# Patient Record
Sex: Female | Born: 1955 | Race: White | Hispanic: No | Marital: Single | Smoking: Current every day smoker
Health system: Southern US, Community
[De-identification: ages and names within clinical notes are randomized; demographics above are authoritative.]

## PROBLEM LIST (undated history)

## (undated) DIAGNOSIS — J449 Chronic obstructive pulmonary disease, unspecified: Secondary | ICD-10-CM

## (undated) DIAGNOSIS — F419 Anxiety disorder, unspecified: Secondary | ICD-10-CM

## (undated) DIAGNOSIS — J45909 Unspecified asthma, uncomplicated: Secondary | ICD-10-CM

## (undated) DIAGNOSIS — M549 Dorsalgia, unspecified: Secondary | ICD-10-CM

## (undated) DIAGNOSIS — G8929 Other chronic pain: Secondary | ICD-10-CM

## (undated) HISTORY — PX: HERNIA REPAIR: SHX51

## (undated) HISTORY — PX: BLADDER REPAIR: SHX76

## (undated) HISTORY — PX: ABDOMINAL HYSTERECTOMY: SHX81

---

## 2001-06-21 ENCOUNTER — Inpatient Hospital Stay (HOSPITAL_COMMUNITY): Admission: RE | Admit: 2001-06-21 | Discharge: 2001-06-23 | Payer: Self-pay | Admitting: *Deleted

## 2001-08-07 ENCOUNTER — Encounter: Payer: Self-pay | Admitting: General Surgery

## 2001-08-09 ENCOUNTER — Ambulatory Visit (HOSPITAL_COMMUNITY): Admission: RE | Admit: 2001-08-09 | Discharge: 2001-08-10 | Payer: Self-pay | Admitting: General Surgery

## 2001-11-19 ENCOUNTER — Other Ambulatory Visit: Admission: RE | Admit: 2001-11-19 | Discharge: 2001-11-19 | Payer: Self-pay | Admitting: Internal Medicine

## 2001-11-27 ENCOUNTER — Other Ambulatory Visit: Admission: RE | Admit: 2001-11-27 | Discharge: 2001-11-27 | Payer: Self-pay | Admitting: Obstetrics and Gynecology

## 2001-12-12 ENCOUNTER — Encounter: Payer: Self-pay | Admitting: Emergency Medicine

## 2001-12-12 ENCOUNTER — Emergency Department (HOSPITAL_COMMUNITY): Admission: EM | Admit: 2001-12-12 | Discharge: 2001-12-12 | Payer: Self-pay | Admitting: Emergency Medicine

## 2002-01-22 ENCOUNTER — Encounter: Payer: Self-pay | Admitting: Obstetrics and Gynecology

## 2002-01-29 ENCOUNTER — Encounter (INDEPENDENT_AMBULATORY_CARE_PROVIDER_SITE_OTHER): Payer: Self-pay | Admitting: Specialist

## 2002-01-29 ENCOUNTER — Inpatient Hospital Stay (HOSPITAL_COMMUNITY): Admission: RE | Admit: 2002-01-29 | Discharge: 2002-01-31 | Payer: Self-pay | Admitting: Obstetrics and Gynecology

## 2002-03-03 ENCOUNTER — Emergency Department (HOSPITAL_COMMUNITY): Admission: EM | Admit: 2002-03-03 | Discharge: 2002-03-04 | Payer: Self-pay

## 2002-04-01 ENCOUNTER — Emergency Department (HOSPITAL_COMMUNITY): Admission: EM | Admit: 2002-04-01 | Discharge: 2002-04-02 | Payer: Self-pay | Admitting: Emergency Medicine

## 2002-04-02 ENCOUNTER — Encounter: Payer: Self-pay | Admitting: Emergency Medicine

## 2002-04-16 ENCOUNTER — Emergency Department (HOSPITAL_COMMUNITY): Admission: EM | Admit: 2002-04-16 | Discharge: 2002-04-16 | Payer: Self-pay | Admitting: *Deleted

## 2002-04-28 ENCOUNTER — Emergency Department (HOSPITAL_COMMUNITY): Admission: EM | Admit: 2002-04-28 | Discharge: 2002-04-28 | Payer: Self-pay | Admitting: Emergency Medicine

## 2002-04-28 ENCOUNTER — Encounter: Payer: Self-pay | Admitting: Emergency Medicine

## 2002-06-14 ENCOUNTER — Emergency Department (HOSPITAL_COMMUNITY): Admission: EM | Admit: 2002-06-14 | Discharge: 2002-06-14 | Payer: Self-pay | Admitting: Emergency Medicine

## 2002-06-14 ENCOUNTER — Encounter: Payer: Self-pay | Admitting: Emergency Medicine

## 2002-07-02 ENCOUNTER — Emergency Department (HOSPITAL_COMMUNITY): Admission: EM | Admit: 2002-07-02 | Discharge: 2002-07-03 | Payer: Self-pay | Admitting: Emergency Medicine

## 2002-07-03 ENCOUNTER — Encounter: Payer: Self-pay | Admitting: Emergency Medicine

## 2002-07-28 ENCOUNTER — Emergency Department (HOSPITAL_COMMUNITY): Admission: EM | Admit: 2002-07-28 | Discharge: 2002-07-28 | Payer: Self-pay | Admitting: Emergency Medicine

## 2002-07-28 ENCOUNTER — Encounter: Payer: Self-pay | Admitting: Emergency Medicine

## 2002-08-05 ENCOUNTER — Other Ambulatory Visit: Admission: RE | Admit: 2002-08-05 | Discharge: 2002-08-05 | Payer: Self-pay | Admitting: *Deleted

## 2002-09-19 ENCOUNTER — Observation Stay (HOSPITAL_COMMUNITY): Admission: RE | Admit: 2002-09-19 | Discharge: 2002-09-20 | Payer: Self-pay | Admitting: *Deleted

## 2002-09-19 ENCOUNTER — Encounter (INDEPENDENT_AMBULATORY_CARE_PROVIDER_SITE_OTHER): Payer: Self-pay

## 2002-11-25 ENCOUNTER — Ambulatory Visit (HOSPITAL_BASED_OUTPATIENT_CLINIC_OR_DEPARTMENT_OTHER): Admission: RE | Admit: 2002-11-25 | Discharge: 2002-11-25 | Payer: Self-pay | Admitting: Otolaryngology

## 2002-11-25 ENCOUNTER — Encounter (INDEPENDENT_AMBULATORY_CARE_PROVIDER_SITE_OTHER): Payer: Self-pay | Admitting: *Deleted

## 2003-03-18 IMAGING — CT CT ABDOMEN W/ CM
1 of 4 series · 14 of 32 positions shown, 19 images · IV contrast (omnipaque)
Comparison: none

FINDINGS
CLINICAL DATA: RIGHT LOWER QUADRANT PAIN.
CT OF THE ABDOMEN AND PELVIS, 07/03/02
ROUTINE MULTIDETECTOR HELICAL CT IMAGING WAS PERFORMED THROUGH THE ABDOMEN AND PELVIS FOLLOWING
ADMINISTRATION OF DILUTE ORAL CONTRAST AND 100 CC OF OMNIPAQUE 300 IV.
COMPARISON 06/14/02.
CT OF THE ABDOMEN WITH CONTRAST
FOCAL FAT IS AGAIN NOTED WITHIN THE LIVER ALONG THE FALCIFORM LIGAMENT.  OTHERWISE, NO FOCAL
LESIONS IN THE LIVER, SPLEEN, PANCREAS, ADRENALS, OR KIDNEYS.  NO EVIDENCE OF HYDRONEPHROSIS OR
STONE.  NO FREE FLUID, FREE AIR, OR ADENOPATHY.  SCATTERED DIVERTICULA ARE NOTED THROUGHOUT THE
COLON.  NO EVIDENCE OF DIVERTICULITIS.
IMPRESSION
NO ACUTE ABNORMALITY IN THE ABDOMEN.
CT OF THE PELVIS WITH CONTRAST
AGAIN, NUMEROUS DIVERTICULA ARE NOTED THROUGHOUT THE COLON.  NO EVIDENCE OF ACTIVE DIVERTICULITIS.
NO FREE FLUID, FREE AIR, OR ADENOPATHY.  IT APPEARS THAT THE PATIENT IS STATUS POST HYSTERECTOMY.
DIVERTICULOSIS.  NO ACUTE DISEASE.

[Series 3: abd/pelvis 5.0 b30f · axial · 0.65mm/px · z∈[-686,-310]mm · 14 of 87 slices shown, 19 images]
[im 6/87  soft-tissue]
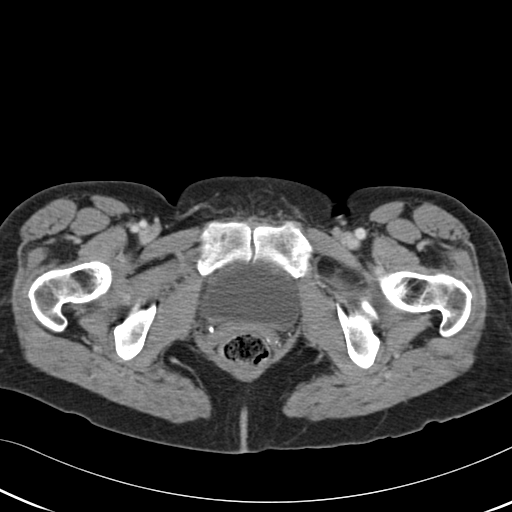
[im 6/87  bone]
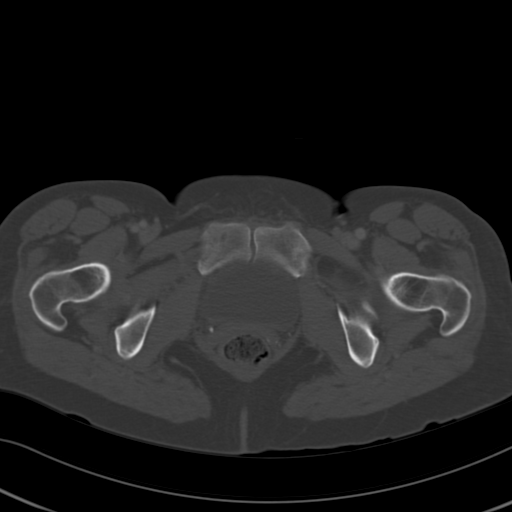
[im 11/87  soft-tissue]
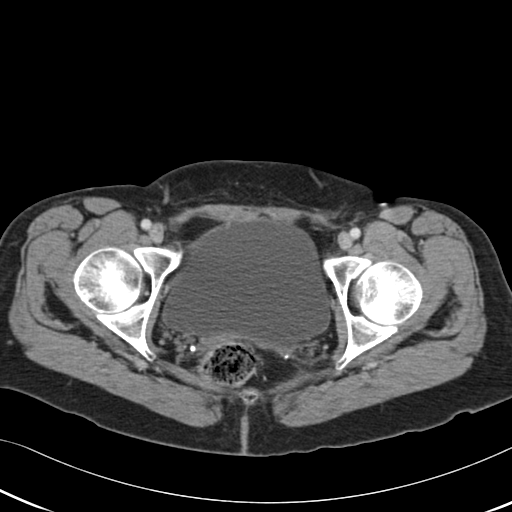
[im 21/87  soft-tissue]
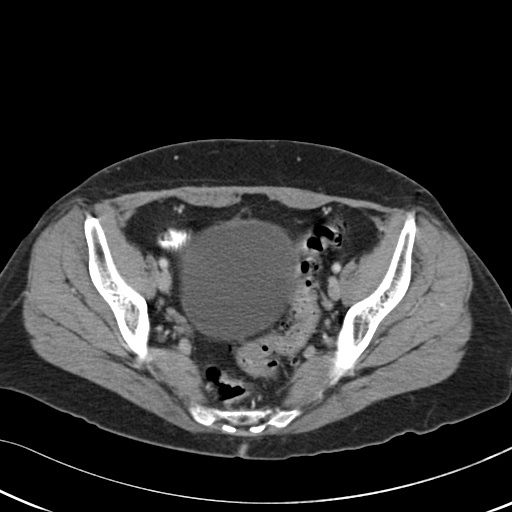
[im 26/87  soft-tissue]
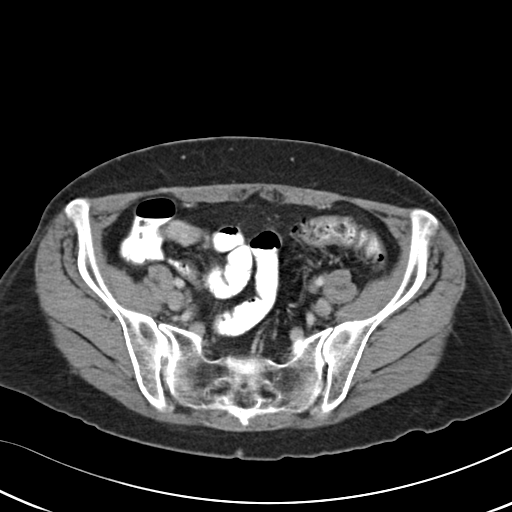
[im 31/87  soft-tissue]
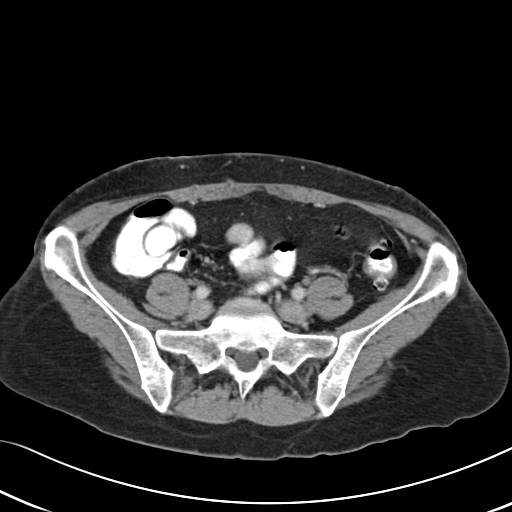
[im 36/87  soft-tissue]
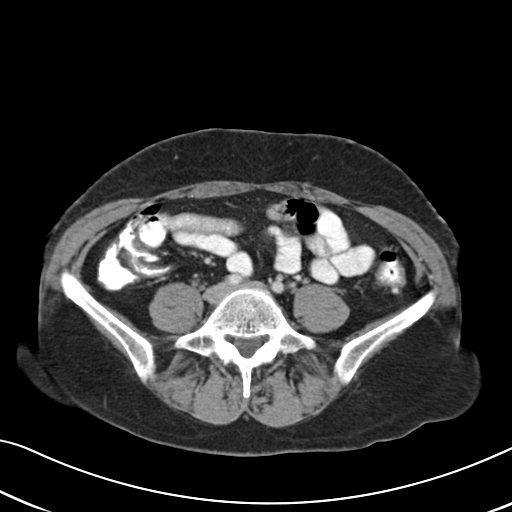
[im 46/87  soft-tissue]
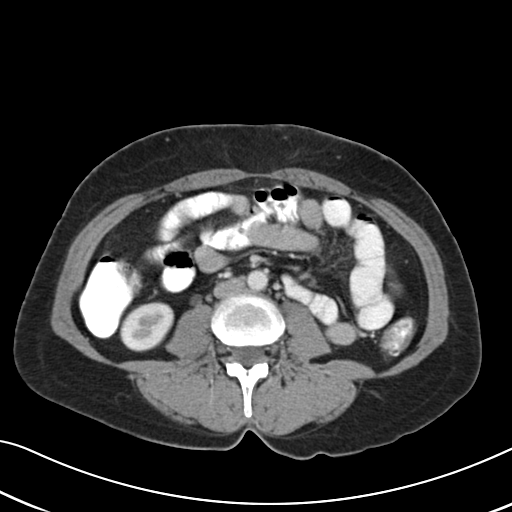
[im 51/87  soft-tissue]
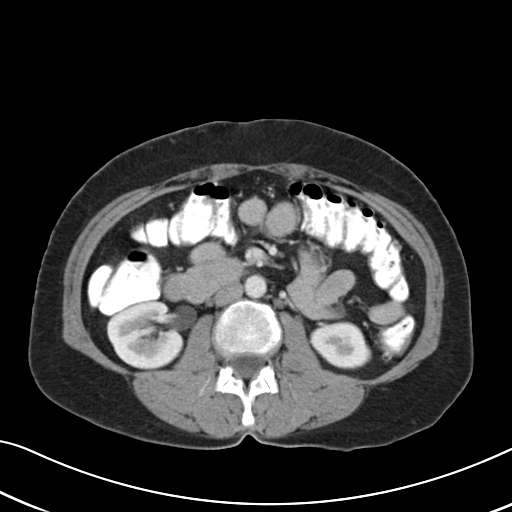
[im 56/87  soft-tissue]
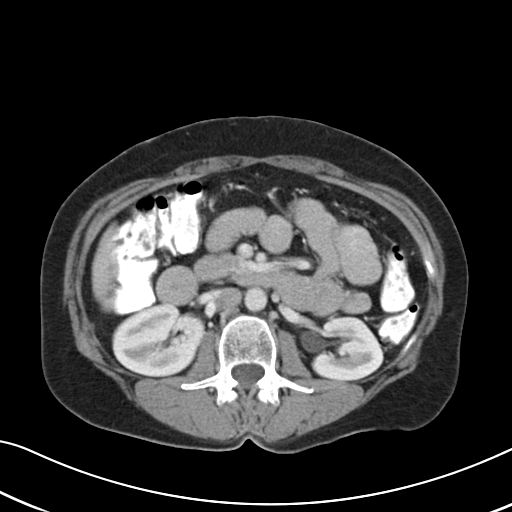
[im 56/87  bone]
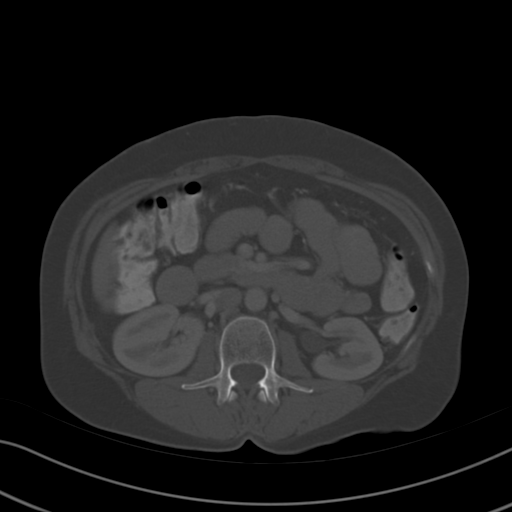
[im 61/87  soft-tissue]
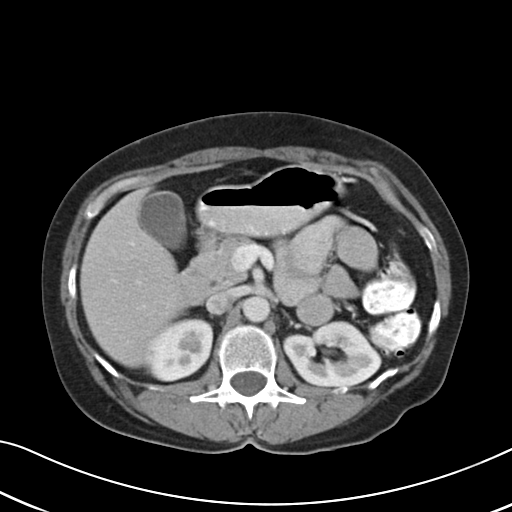
[im 66/87  soft-tissue]
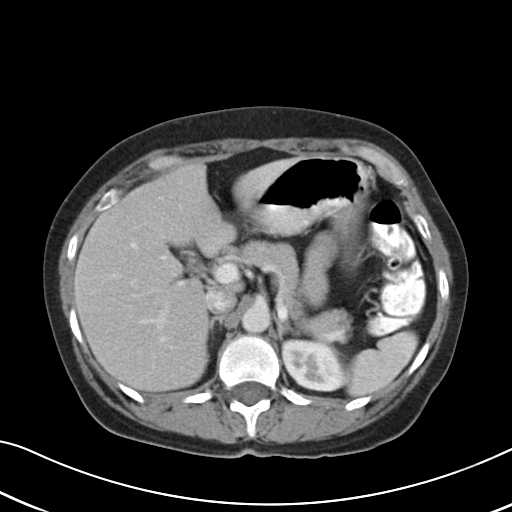
[im 66/87  lung]
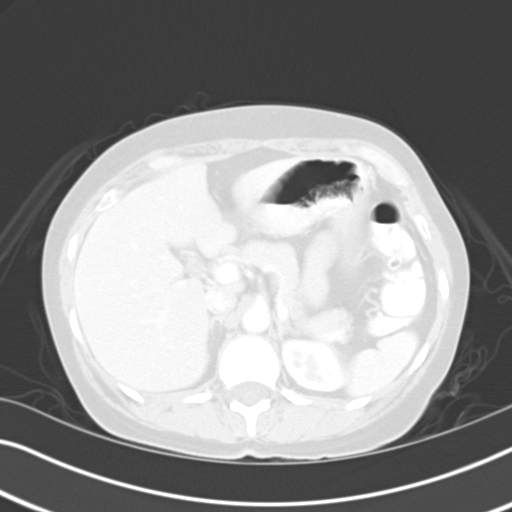
[im 71/87  lung]
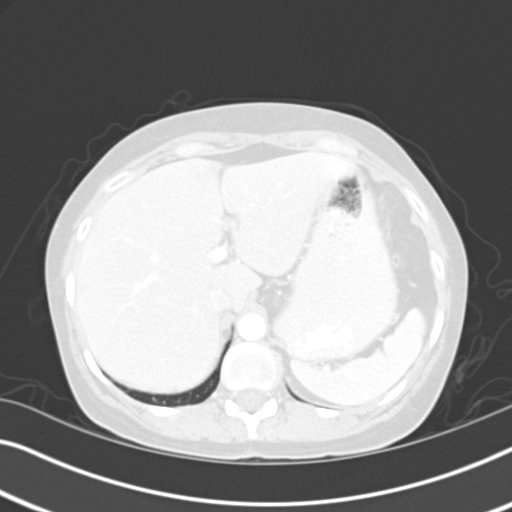
[im 76/87  soft-tissue]
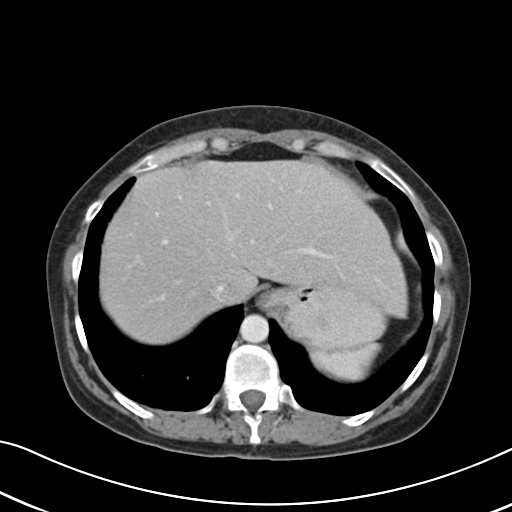
[im 76/87  lung]
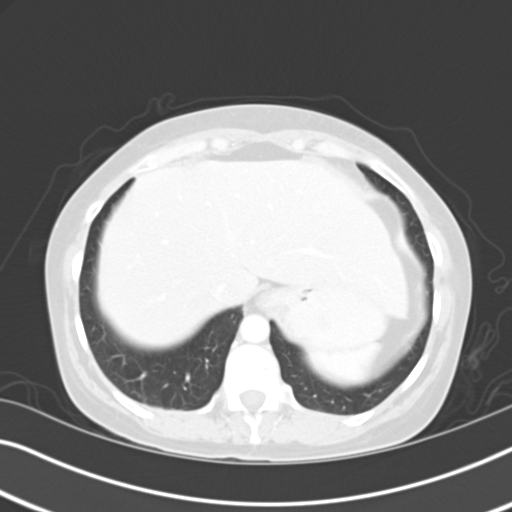
[im 81/87  soft-tissue]
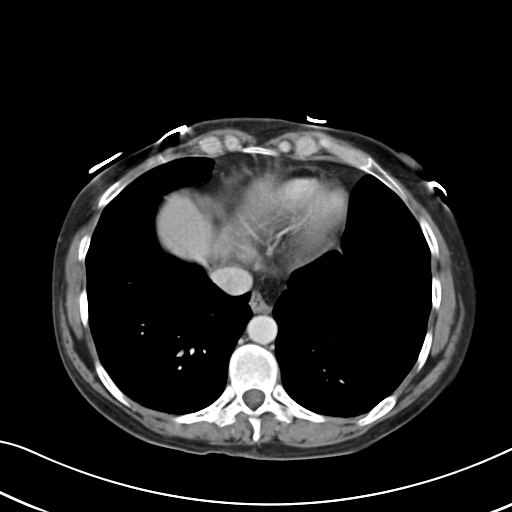
[im 81/87  lung]
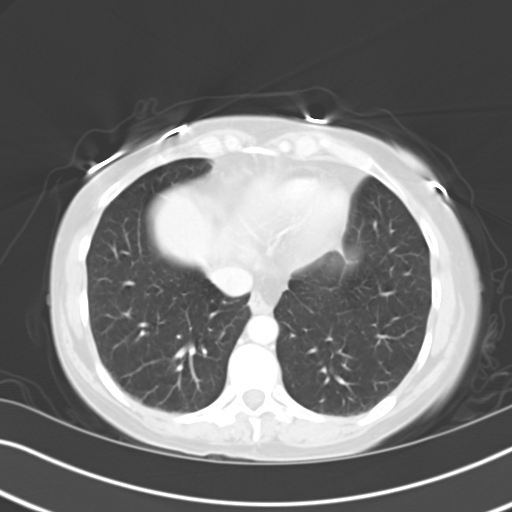

[14 of 32 positions shown; findings below may reference images not displayed]

## 2004-02-02 ENCOUNTER — Other Ambulatory Visit: Admission: RE | Admit: 2004-02-02 | Discharge: 2004-02-02 | Payer: Self-pay | Admitting: *Deleted

## 2004-05-30 ENCOUNTER — Emergency Department (HOSPITAL_COMMUNITY): Admission: EM | Admit: 2004-05-30 | Discharge: 2004-05-31 | Payer: Self-pay | Admitting: Emergency Medicine

## 2004-06-30 ENCOUNTER — Ambulatory Visit (HOSPITAL_COMMUNITY): Admission: RE | Admit: 2004-06-30 | Discharge: 2004-06-30 | Payer: Self-pay | Admitting: General Surgery

## 2004-07-26 ENCOUNTER — Ambulatory Visit: Payer: Self-pay | Admitting: Obstetrics and Gynecology

## 2004-09-15 ENCOUNTER — Inpatient Hospital Stay (HOSPITAL_COMMUNITY): Admission: RE | Admit: 2004-09-15 | Discharge: 2004-09-16 | Payer: Self-pay | Admitting: Obstetrics and Gynecology

## 2004-10-21 ENCOUNTER — Emergency Department (HOSPITAL_COMMUNITY): Admission: EM | Admit: 2004-10-21 | Discharge: 2004-10-21 | Payer: Self-pay | Admitting: Emergency Medicine

## 2004-11-02 ENCOUNTER — Ambulatory Visit: Payer: Self-pay | Admitting: Internal Medicine

## 2004-11-11 ENCOUNTER — Ambulatory Visit: Payer: Self-pay | Admitting: Internal Medicine

## 2004-11-11 ENCOUNTER — Ambulatory Visit (HOSPITAL_COMMUNITY): Admission: RE | Admit: 2004-11-11 | Discharge: 2004-11-11 | Payer: Self-pay | Admitting: Internal Medicine

## 2004-12-02 ENCOUNTER — Emergency Department (HOSPITAL_COMMUNITY): Admission: EM | Admit: 2004-12-02 | Discharge: 2004-12-02 | Payer: Self-pay | Admitting: Emergency Medicine

## 2004-12-02 ENCOUNTER — Ambulatory Visit (HOSPITAL_COMMUNITY): Admission: RE | Admit: 2004-12-02 | Discharge: 2004-12-02 | Payer: Self-pay | Admitting: Internal Medicine

## 2004-12-23 ENCOUNTER — Emergency Department (HOSPITAL_COMMUNITY): Admission: EM | Admit: 2004-12-23 | Discharge: 2004-12-23 | Payer: Self-pay | Admitting: Emergency Medicine

## 2005-01-16 ENCOUNTER — Ambulatory Visit (HOSPITAL_COMMUNITY): Admission: RE | Admit: 2005-01-16 | Discharge: 2005-01-16 | Payer: Self-pay | Admitting: Internal Medicine

## 2005-04-19 ENCOUNTER — Inpatient Hospital Stay (HOSPITAL_COMMUNITY): Admission: RE | Admit: 2005-04-19 | Discharge: 2005-04-21 | Payer: Self-pay | Admitting: Obstetrics & Gynecology

## 2005-04-22 ENCOUNTER — Emergency Department (HOSPITAL_COMMUNITY): Admission: EM | Admit: 2005-04-22 | Discharge: 2005-04-22 | Payer: Self-pay | Admitting: Emergency Medicine

## 2005-07-17 ENCOUNTER — Inpatient Hospital Stay (HOSPITAL_COMMUNITY): Admission: EM | Admit: 2005-07-17 | Discharge: 2005-07-20 | Payer: Self-pay | Admitting: Emergency Medicine

## 2005-07-18 ENCOUNTER — Ambulatory Visit: Payer: Self-pay | Admitting: *Deleted

## 2005-10-05 ENCOUNTER — Encounter (INDEPENDENT_AMBULATORY_CARE_PROVIDER_SITE_OTHER): Payer: Self-pay | Admitting: *Deleted

## 2005-10-05 ENCOUNTER — Inpatient Hospital Stay (HOSPITAL_COMMUNITY): Admission: RE | Admit: 2005-10-05 | Discharge: 2005-10-07 | Payer: Self-pay | Admitting: *Deleted

## 2005-10-14 ENCOUNTER — Emergency Department (HOSPITAL_COMMUNITY): Admission: EM | Admit: 2005-10-14 | Discharge: 2005-10-14 | Payer: Self-pay | Admitting: Emergency Medicine

## 2006-01-19 ENCOUNTER — Emergency Department (HOSPITAL_COMMUNITY): Admission: EM | Admit: 2006-01-19 | Discharge: 2006-01-19 | Payer: Self-pay | Admitting: Emergency Medicine

## 2006-03-05 ENCOUNTER — Emergency Department (HOSPITAL_COMMUNITY): Admission: EM | Admit: 2006-03-05 | Discharge: 2006-03-05 | Payer: Self-pay | Admitting: Emergency Medicine

## 2006-04-13 ENCOUNTER — Emergency Department (HOSPITAL_COMMUNITY): Admission: EM | Admit: 2006-04-13 | Discharge: 2006-04-13 | Payer: Self-pay | Admitting: Emergency Medicine

## 2006-05-12 ENCOUNTER — Inpatient Hospital Stay (HOSPITAL_COMMUNITY): Admission: EM | Admit: 2006-05-12 | Discharge: 2006-05-18 | Payer: Self-pay | Admitting: Psychiatry

## 2006-05-13 ENCOUNTER — Ambulatory Visit: Payer: Self-pay | Admitting: Psychiatry

## 2006-05-14 ENCOUNTER — Encounter (HOSPITAL_COMMUNITY): Payer: Self-pay | Admitting: Psychiatry

## 2006-05-22 ENCOUNTER — Inpatient Hospital Stay (HOSPITAL_COMMUNITY): Admission: AD | Admit: 2006-05-22 | Discharge: 2006-05-25 | Payer: Self-pay | Admitting: Psychiatry

## 2007-01-15 ENCOUNTER — Inpatient Hospital Stay (HOSPITAL_COMMUNITY): Admission: EM | Admit: 2007-01-15 | Discharge: 2007-02-05 | Payer: Self-pay | Admitting: Psychiatry

## 2007-01-15 ENCOUNTER — Ambulatory Visit: Payer: Self-pay | Admitting: *Deleted

## 2007-01-23 ENCOUNTER — Encounter: Payer: Self-pay | Admitting: *Deleted

## 2007-04-16 ENCOUNTER — Emergency Department (HOSPITAL_COMMUNITY): Admission: EM | Admit: 2007-04-16 | Discharge: 2007-04-16 | Payer: Self-pay | Admitting: Emergency Medicine

## 2007-05-15 ENCOUNTER — Encounter (INDEPENDENT_AMBULATORY_CARE_PROVIDER_SITE_OTHER): Payer: Self-pay | Admitting: Family Medicine

## 2007-05-22 ENCOUNTER — Ambulatory Visit: Payer: Self-pay | Admitting: Family Medicine

## 2007-05-22 ENCOUNTER — Ambulatory Visit (HOSPITAL_COMMUNITY): Admission: RE | Admit: 2007-05-22 | Discharge: 2007-05-22 | Payer: Self-pay | Admitting: Family Medicine

## 2007-05-22 DIAGNOSIS — R5383 Other fatigue: Secondary | ICD-10-CM

## 2007-05-22 DIAGNOSIS — R5381 Other malaise: Secondary | ICD-10-CM | POA: Insufficient documentation

## 2007-05-22 DIAGNOSIS — M545 Low back pain: Secondary | ICD-10-CM

## 2007-05-22 LAB — CONVERTED CEMR LAB
ALT: 16 units/L (ref 0–35)
AST: 19 units/L (ref 0–37)
Albumin: 4.3 g/dL (ref 3.5–5.2)
Alkaline Phosphatase: 60 units/L (ref 39–117)
Amylase: 109 units/L (ref 27–131)
BUN: 8 mg/dL (ref 6–23)
Basophils Absolute: 0 10*3/uL (ref 0.0–0.1)
Basophils Relative: 0 % (ref 0–1)
Bilirubin Urine: NEGATIVE
Blood in Urine, dipstick: NEGATIVE
CO2: 27 meq/L (ref 19–32)
Calcium: 9.4 mg/dL (ref 8.4–10.5)
Chloride: 106 meq/L (ref 96–112)
Creatinine, Ser: 0.84 mg/dL (ref 0.40–1.20)
Eosinophils Absolute: 0.2 10*3/uL (ref 0.0–0.7)
Eosinophils Relative: 3 % (ref 0–5)
Glucose, Bld: 100 mg/dL — ABNORMAL HIGH (ref 70–99)
Glucose, Urine, Semiquant: NEGATIVE
HCT: 42.4 % (ref 36.0–46.0)
Hemoglobin: 14.6 g/dL (ref 12.0–15.0)
Lipase: 19 units/L (ref 11–59)
Lymphocytes Relative: 43 % (ref 12–46)
Lymphs Abs: 2.2 10*3/uL (ref 0.7–3.3)
MCHC: 34.5 g/dL (ref 30.0–36.0)
MCV: 91.5 fL (ref 78.0–100.0)
Monocytes Absolute: 0.4 10*3/uL (ref 0.2–0.7)
Monocytes Relative: 8 % (ref 3–11)
Neutro Abs: 2.3 10*3/uL (ref 1.7–7.7)
Neutrophils Relative %: 44 % (ref 43–77)
Nitrite: NEGATIVE
Platelets: 268 10*3/uL (ref 150–400)
Potassium: 4 meq/L (ref 3.5–5.3)
Protein, U semiquant: NEGATIVE
RBC: 4.63 M/uL (ref 3.87–5.11)
RDW: 13.3 % (ref 11.5–14.0)
Sodium: 140 meq/L (ref 135–145)
Specific Gravity, Urine: 1.02
Total Bilirubin: 0.7 mg/dL (ref 0.3–1.2)
Total Protein: 6.6 g/dL (ref 6.0–8.3)
Urobilinogen, UA: 0.2
WBC: 5.2 10*3/uL (ref 4.0–10.5)
pH: 7.5

## 2007-05-23 ENCOUNTER — Telehealth (INDEPENDENT_AMBULATORY_CARE_PROVIDER_SITE_OTHER): Payer: Self-pay | Admitting: *Deleted

## 2007-05-23 LAB — CONVERTED CEMR LAB: TSH: 1.798 microintl units/mL (ref 0.350–5.50)

## 2007-05-24 ENCOUNTER — Ambulatory Visit: Payer: Self-pay | Admitting: Family Medicine

## 2007-05-27 ENCOUNTER — Telehealth (INDEPENDENT_AMBULATORY_CARE_PROVIDER_SITE_OTHER): Payer: Self-pay | Admitting: *Deleted

## 2007-05-27 ENCOUNTER — Encounter (INDEPENDENT_AMBULATORY_CARE_PROVIDER_SITE_OTHER): Payer: Self-pay | Admitting: Family Medicine

## 2007-05-31 ENCOUNTER — Encounter (INDEPENDENT_AMBULATORY_CARE_PROVIDER_SITE_OTHER): Payer: Self-pay | Admitting: Family Medicine

## 2007-06-21 ENCOUNTER — Encounter (INDEPENDENT_AMBULATORY_CARE_PROVIDER_SITE_OTHER): Payer: Self-pay | Admitting: Family Medicine

## 2007-07-09 ENCOUNTER — Encounter (INDEPENDENT_AMBULATORY_CARE_PROVIDER_SITE_OTHER): Payer: Self-pay | Admitting: Family Medicine

## 2007-07-15 ENCOUNTER — Ambulatory Visit: Payer: Self-pay | Admitting: Family Medicine

## 2007-07-15 DIAGNOSIS — F17200 Nicotine dependence, unspecified, uncomplicated: Secondary | ICD-10-CM

## 2007-07-16 ENCOUNTER — Ambulatory Visit (HOSPITAL_COMMUNITY): Admission: RE | Admit: 2007-07-16 | Discharge: 2007-07-16 | Payer: Self-pay | Admitting: Family Medicine

## 2007-08-05 ENCOUNTER — Telehealth (INDEPENDENT_AMBULATORY_CARE_PROVIDER_SITE_OTHER): Payer: Self-pay | Admitting: Family Medicine

## 2007-08-05 ENCOUNTER — Emergency Department (HOSPITAL_COMMUNITY): Admission: EM | Admit: 2007-08-05 | Discharge: 2007-08-05 | Payer: Self-pay | Admitting: Emergency Medicine

## 2007-12-12 ENCOUNTER — Other Ambulatory Visit: Payer: Self-pay

## 2007-12-12 ENCOUNTER — Other Ambulatory Visit: Payer: Self-pay | Admitting: Emergency Medicine

## 2007-12-12 ENCOUNTER — Inpatient Hospital Stay (HOSPITAL_COMMUNITY): Admission: AD | Admit: 2007-12-12 | Discharge: 2007-12-20 | Payer: Self-pay | Admitting: Psychiatry

## 2007-12-12 ENCOUNTER — Ambulatory Visit: Payer: Self-pay | Admitting: Psychiatry

## 2007-12-28 ENCOUNTER — Other Ambulatory Visit: Payer: Self-pay | Admitting: Emergency Medicine

## 2007-12-28 ENCOUNTER — Inpatient Hospital Stay (HOSPITAL_COMMUNITY): Admission: AD | Admit: 2007-12-28 | Discharge: 2008-01-01 | Payer: Self-pay | Admitting: Psychiatry

## 2008-01-04 ENCOUNTER — Inpatient Hospital Stay (HOSPITAL_COMMUNITY): Admission: EM | Admit: 2008-01-04 | Discharge: 2008-01-06 | Payer: Self-pay | Admitting: Emergency Medicine

## 2008-01-05 ENCOUNTER — Encounter (INDEPENDENT_AMBULATORY_CARE_PROVIDER_SITE_OTHER): Payer: Self-pay | Admitting: Family Medicine

## 2008-01-06 ENCOUNTER — Inpatient Hospital Stay (HOSPITAL_COMMUNITY): Admission: EM | Admit: 2008-01-06 | Discharge: 2008-01-16 | Payer: Self-pay | Admitting: Psychiatry

## 2008-01-06 ENCOUNTER — Ambulatory Visit: Payer: Self-pay | Admitting: Psychiatry

## 2008-01-16 ENCOUNTER — Encounter (INDEPENDENT_AMBULATORY_CARE_PROVIDER_SITE_OTHER): Payer: Self-pay | Admitting: Family Medicine

## 2008-02-04 ENCOUNTER — Ambulatory Visit: Payer: Self-pay | Admitting: Family Medicine

## 2008-02-04 ENCOUNTER — Telehealth (INDEPENDENT_AMBULATORY_CARE_PROVIDER_SITE_OTHER): Payer: Self-pay | Admitting: *Deleted

## 2008-02-04 DIAGNOSIS — K029 Dental caries, unspecified: Secondary | ICD-10-CM | POA: Insufficient documentation

## 2008-02-04 DIAGNOSIS — J309 Allergic rhinitis, unspecified: Secondary | ICD-10-CM | POA: Insufficient documentation

## 2008-02-05 ENCOUNTER — Encounter (INDEPENDENT_AMBULATORY_CARE_PROVIDER_SITE_OTHER): Payer: Self-pay | Admitting: Family Medicine

## 2008-02-05 ENCOUNTER — Telehealth (INDEPENDENT_AMBULATORY_CARE_PROVIDER_SITE_OTHER): Payer: Self-pay | Admitting: *Deleted

## 2008-02-05 LAB — CONVERTED CEMR LAB
Albumin: 4.6 g/dL (ref 3.5–5.2)
Alkaline Phosphatase: 79 units/L (ref 39–117)
BUN: 10 mg/dL (ref 6–23)
CO2: 20 meq/L (ref 19–32)
Eosinophils Absolute: 0.2 10*3/uL (ref 0.0–0.7)
Eosinophils Relative: 4 % (ref 0–5)
Glucose, Bld: 79 mg/dL (ref 70–99)
HCT: 46 % (ref 36.0–46.0)
Lymphocytes Relative: 39 % (ref 12–46)
Lymphs Abs: 2.2 10*3/uL (ref 0.7–4.0)
MCV: 94.3 fL (ref 78.0–100.0)
Monocytes Relative: 7 % (ref 3–12)
Potassium: 4.5 meq/L (ref 3.5–5.3)
RBC: 4.88 M/uL (ref 3.87–5.11)
Sodium: 140 meq/L (ref 135–145)
TSH: 1.985 microintl units/mL (ref 0.350–5.50)
Total Bilirubin: 0.3 mg/dL (ref 0.3–1.2)
Total Protein: 7.4 g/dL (ref 6.0–8.3)
WBC: 5.6 10*3/uL (ref 4.0–10.5)

## 2008-02-18 ENCOUNTER — Telehealth (INDEPENDENT_AMBULATORY_CARE_PROVIDER_SITE_OTHER): Payer: Self-pay | Admitting: Family Medicine

## 2008-02-18 ENCOUNTER — Ambulatory Visit: Payer: Self-pay | Admitting: Family Medicine

## 2008-02-18 DIAGNOSIS — F341 Dysthymic disorder: Secondary | ICD-10-CM

## 2008-02-20 ENCOUNTER — Encounter (INDEPENDENT_AMBULATORY_CARE_PROVIDER_SITE_OTHER): Payer: Self-pay | Admitting: Family Medicine

## 2008-02-20 ENCOUNTER — Telehealth (INDEPENDENT_AMBULATORY_CARE_PROVIDER_SITE_OTHER): Payer: Self-pay | Admitting: Family Medicine

## 2008-02-25 ENCOUNTER — Telehealth (INDEPENDENT_AMBULATORY_CARE_PROVIDER_SITE_OTHER): Payer: Self-pay | Admitting: Family Medicine

## 2008-03-03 ENCOUNTER — Ambulatory Visit: Payer: Self-pay | Admitting: Family Medicine

## 2008-03-03 DIAGNOSIS — R030 Elevated blood-pressure reading, without diagnosis of hypertension: Secondary | ICD-10-CM

## 2008-03-31 ENCOUNTER — Telehealth (INDEPENDENT_AMBULATORY_CARE_PROVIDER_SITE_OTHER): Payer: Self-pay | Admitting: *Deleted

## 2008-04-06 ENCOUNTER — Encounter (INDEPENDENT_AMBULATORY_CARE_PROVIDER_SITE_OTHER): Payer: Self-pay | Admitting: Family Medicine

## 2008-04-07 ENCOUNTER — Ambulatory Visit: Payer: Self-pay | Admitting: Family Medicine

## 2008-04-07 DIAGNOSIS — R079 Chest pain, unspecified: Secondary | ICD-10-CM | POA: Insufficient documentation

## 2008-04-07 DIAGNOSIS — R0602 Shortness of breath: Secondary | ICD-10-CM

## 2008-04-14 ENCOUNTER — Ambulatory Visit (HOSPITAL_COMMUNITY): Admission: RE | Admit: 2008-04-14 | Discharge: 2008-04-14 | Payer: Self-pay | Admitting: Family Medicine

## 2008-04-14 ENCOUNTER — Encounter (INDEPENDENT_AMBULATORY_CARE_PROVIDER_SITE_OTHER): Payer: Self-pay | Admitting: Family Medicine

## 2008-04-15 LAB — CONVERTED CEMR LAB
ALT: 11 units/L (ref 0–35)
AST: 15 units/L (ref 0–37)
Albumin: 4.6 g/dL (ref 3.5–5.2)
Alkaline Phosphatase: 61 units/L (ref 39–117)
BUN: 11 mg/dL (ref 6–23)
Basophils Absolute: 0 10*3/uL (ref 0.0–0.1)
Basophils Relative: 1 % (ref 0–1)
Calcium: 9.3 mg/dL (ref 8.4–10.5)
Chloride: 106 meq/L (ref 96–112)
Eosinophils Absolute: 0.1 10*3/uL (ref 0.0–0.7)
HDL: 49 mg/dL (ref >39)
LDL Cholesterol: 138 mg/dL — ABNORMAL HIGH (ref 0–99)
MCHC: 32.6 g/dL (ref 30.0–36.0)
MCV: 95.4 fL (ref 78.0–100.0)
Neutro Abs: 1.7 10*3/uL (ref 1.7–7.7)
Neutrophils Relative %: 46 % (ref 43–77)
Platelets: 278 10*3/uL (ref 150–400)
Potassium: 4 meq/L (ref 3.5–5.3)
RBC: 4.79 M/uL (ref 3.87–5.11)
RDW: 14 % (ref 11.5–15.5)
Sodium: 142 meq/L (ref 135–145)
TSH: 1.154 microintl units/mL (ref 0.350–5.50)
Total Protein: 7.5 g/dL (ref 6.0–8.3)

## 2008-04-30 ENCOUNTER — Ambulatory Visit: Payer: Self-pay | Admitting: Cardiology

## 2008-05-05 ENCOUNTER — Ambulatory Visit: Payer: Self-pay | Admitting: Family Medicine

## 2008-05-05 DIAGNOSIS — R110 Nausea: Secondary | ICD-10-CM

## 2008-05-05 DIAGNOSIS — E785 Hyperlipidemia, unspecified: Secondary | ICD-10-CM | POA: Insufficient documentation

## 2008-05-05 LAB — CONVERTED CEMR LAB: OCCULT 1: NEGATIVE

## 2008-05-07 ENCOUNTER — Ambulatory Visit (HOSPITAL_COMMUNITY): Admission: RE | Admit: 2008-05-07 | Discharge: 2008-05-07 | Payer: Self-pay | Admitting: Cardiology

## 2008-05-07 ENCOUNTER — Encounter: Payer: Self-pay | Admitting: Cardiology

## 2008-05-07 ENCOUNTER — Ambulatory Visit: Payer: Self-pay | Admitting: Cardiology

## 2008-05-11 ENCOUNTER — Encounter (INDEPENDENT_AMBULATORY_CARE_PROVIDER_SITE_OTHER): Payer: Self-pay | Admitting: Family Medicine

## 2008-05-15 ENCOUNTER — Ambulatory Visit (HOSPITAL_COMMUNITY)
Admission: RE | Admit: 2008-05-15 | Discharge: 2008-05-15 | Payer: Self-pay | Admitting: Physical Medicine and Rehabilitation

## 2008-05-20 ENCOUNTER — Ambulatory Visit: Payer: Self-pay | Admitting: Family Medicine

## 2008-05-20 DIAGNOSIS — N644 Mastodynia: Secondary | ICD-10-CM

## 2008-05-20 DIAGNOSIS — R109 Unspecified abdominal pain: Secondary | ICD-10-CM

## 2008-05-31 ENCOUNTER — Inpatient Hospital Stay (HOSPITAL_COMMUNITY): Admission: EM | Admit: 2008-05-31 | Discharge: 2008-06-03 | Payer: Self-pay | Admitting: Emergency Medicine

## 2008-06-03 ENCOUNTER — Inpatient Hospital Stay (HOSPITAL_COMMUNITY): Admission: AD | Admit: 2008-06-03 | Discharge: 2008-06-09 | Payer: Self-pay | Admitting: Psychiatry

## 2008-06-03 ENCOUNTER — Ambulatory Visit: Payer: Self-pay | Admitting: Psychiatry

## 2008-06-23 ENCOUNTER — Ambulatory Visit: Payer: Self-pay | Admitting: Family Medicine

## 2008-07-17 ENCOUNTER — Encounter (INDEPENDENT_AMBULATORY_CARE_PROVIDER_SITE_OTHER): Payer: Self-pay | Admitting: Family Medicine

## 2008-08-05 ENCOUNTER — Ambulatory Visit: Payer: Self-pay | Admitting: Internal Medicine

## 2008-08-05 DIAGNOSIS — K047 Periapical abscess without sinus: Secondary | ICD-10-CM

## 2008-08-06 ENCOUNTER — Ambulatory Visit: Payer: Self-pay | Admitting: Family Medicine

## 2008-08-17 ENCOUNTER — Encounter (INDEPENDENT_AMBULATORY_CARE_PROVIDER_SITE_OTHER): Payer: Self-pay | Admitting: Family Medicine

## 2009-01-06 ENCOUNTER — Inpatient Hospital Stay (HOSPITAL_COMMUNITY): Admission: EM | Admit: 2009-01-06 | Discharge: 2009-01-10 | Payer: Self-pay | Admitting: Emergency Medicine

## 2009-01-12 ENCOUNTER — Other Ambulatory Visit: Payer: Self-pay | Admitting: Emergency Medicine

## 2009-01-12 ENCOUNTER — Inpatient Hospital Stay (HOSPITAL_COMMUNITY): Admission: RE | Admit: 2009-01-12 | Discharge: 2009-01-15 | Payer: Self-pay | Admitting: *Deleted

## 2009-01-12 ENCOUNTER — Ambulatory Visit: Payer: Self-pay | Admitting: *Deleted

## 2009-01-20 ENCOUNTER — Other Ambulatory Visit: Payer: Self-pay | Admitting: Emergency Medicine

## 2009-01-20 ENCOUNTER — Inpatient Hospital Stay (HOSPITAL_COMMUNITY): Admission: RE | Admit: 2009-01-20 | Discharge: 2009-02-09 | Payer: Self-pay | Admitting: Psychiatry

## 2009-01-24 ENCOUNTER — Encounter: Payer: Self-pay | Admitting: Emergency Medicine

## 2009-02-08 ENCOUNTER — Other Ambulatory Visit: Payer: Self-pay | Admitting: Emergency Medicine

## 2009-05-04 ENCOUNTER — Emergency Department (HOSPITAL_COMMUNITY): Admission: EM | Admit: 2009-05-04 | Discharge: 2009-05-04 | Payer: Self-pay | Admitting: Emergency Medicine

## 2009-07-06 ENCOUNTER — Other Ambulatory Visit: Payer: Self-pay

## 2009-07-06 ENCOUNTER — Ambulatory Visit: Payer: Self-pay | Admitting: Psychiatry

## 2009-07-06 ENCOUNTER — Other Ambulatory Visit: Payer: Self-pay | Admitting: Emergency Medicine

## 2009-07-07 ENCOUNTER — Inpatient Hospital Stay (HOSPITAL_COMMUNITY): Admission: RE | Admit: 2009-07-07 | Discharge: 2009-07-12 | Payer: Self-pay | Admitting: Psychiatry

## 2009-07-17 ENCOUNTER — Other Ambulatory Visit: Payer: Self-pay | Admitting: Emergency Medicine

## 2009-07-17 ENCOUNTER — Inpatient Hospital Stay (HOSPITAL_COMMUNITY): Admission: AD | Admit: 2009-07-17 | Discharge: 2009-07-28 | Payer: Self-pay | Admitting: Psychiatry

## 2009-07-22 ENCOUNTER — Encounter: Payer: Self-pay | Admitting: Emergency Medicine

## 2009-07-29 ENCOUNTER — Inpatient Hospital Stay (HOSPITAL_COMMUNITY): Admission: EM | Admit: 2009-07-29 | Discharge: 2009-08-01 | Payer: Self-pay | Admitting: Emergency Medicine

## 2009-12-29 ENCOUNTER — Emergency Department (HOSPITAL_COMMUNITY): Admission: EM | Admit: 2009-12-29 | Discharge: 2009-12-30 | Payer: Self-pay | Admitting: Emergency Medicine

## 2010-02-09 ENCOUNTER — Ambulatory Visit (HOSPITAL_COMMUNITY): Admission: RE | Admit: 2010-02-09 | Discharge: 2010-02-09 | Payer: Self-pay | Admitting: Family Medicine

## 2010-04-14 IMAGING — US US ABDOMEN COMPLETE
1 series · 13 of 25 positions shown · non-contrast
Comparison: Acute abdominal series 07/22/2009 and abdominal pelvic
CT 06/11/2007.

CLINICAL DATA: Right upper quadrant abdominal pain.  Drug
overdose.

COMPLETE ABDOMINAL ULTRASOUND

[Series 1: unknown · 0.37mm/px · 13 of 51 slices shown]
[im 1/51]
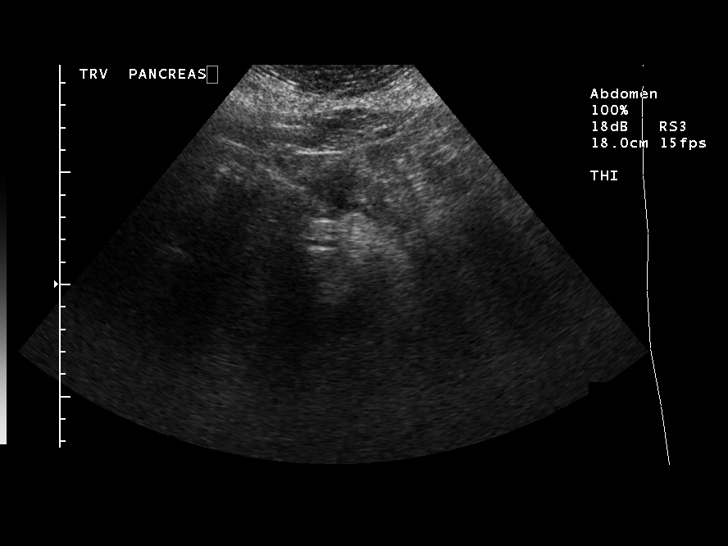
[im 5/51]
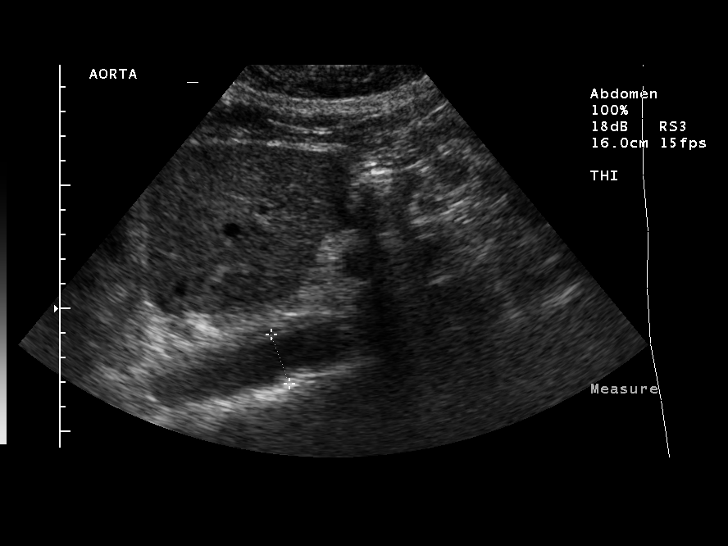
[im 9/51]
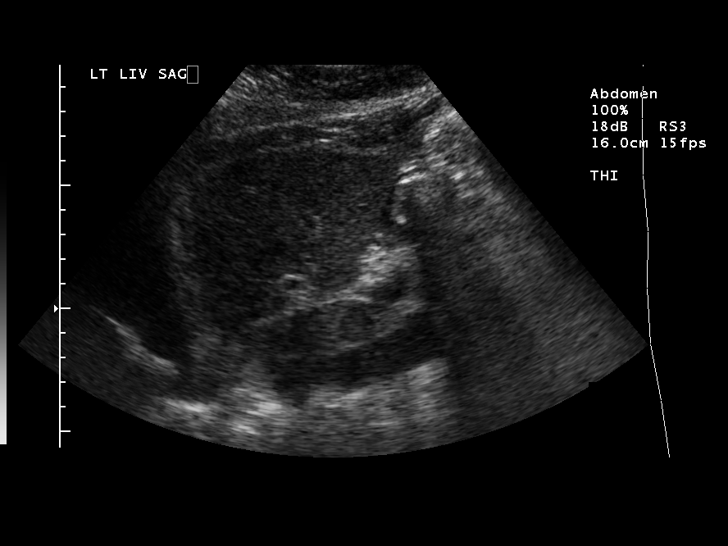
[im 13/51]
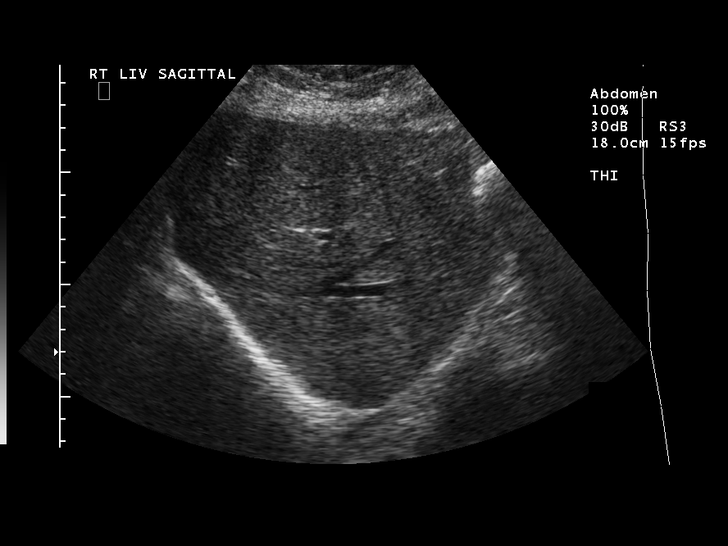
[im 17/51]
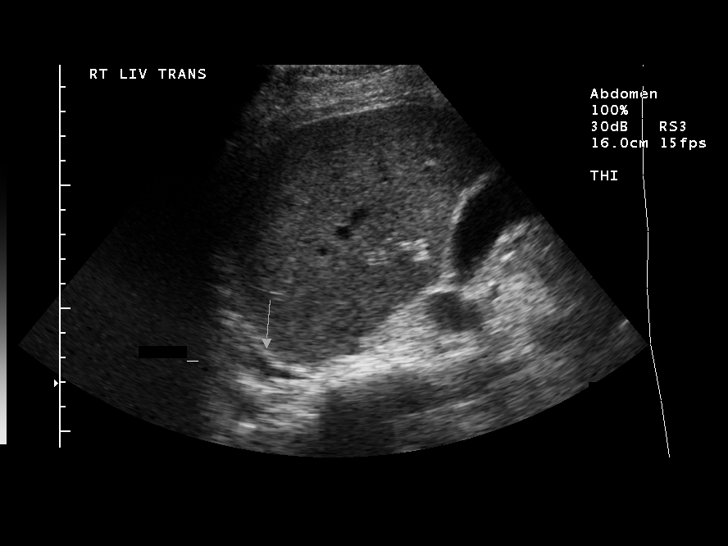
[im 21/51]
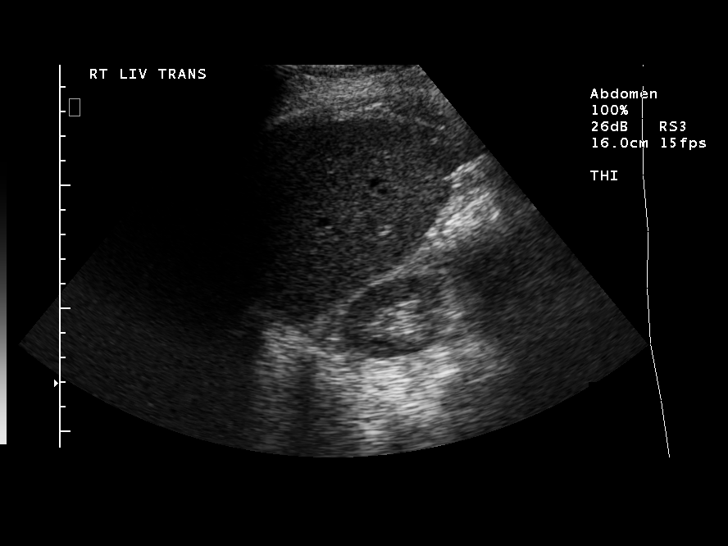
[im 26/51]
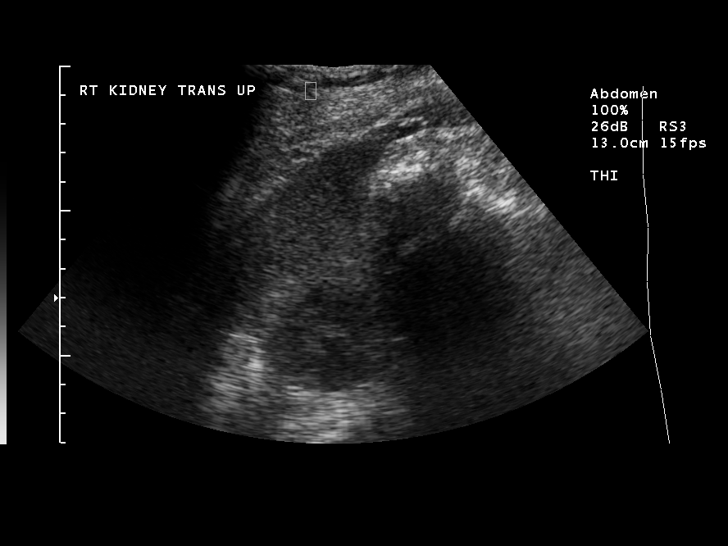
[im 30/51]
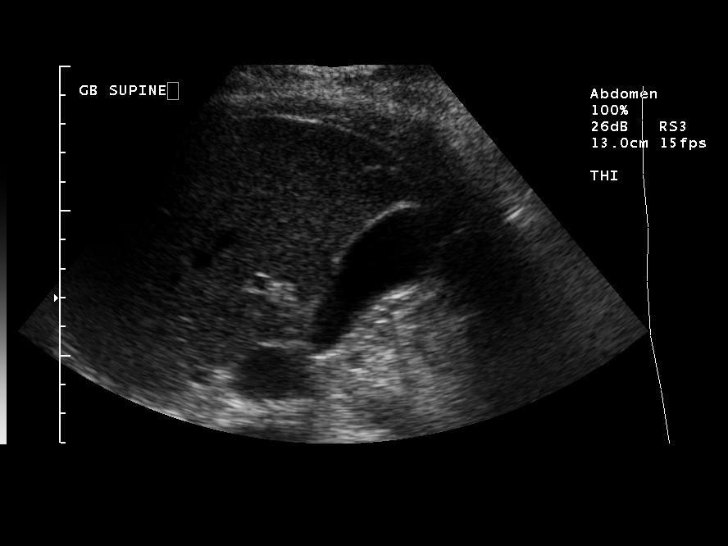
[im 34/51]
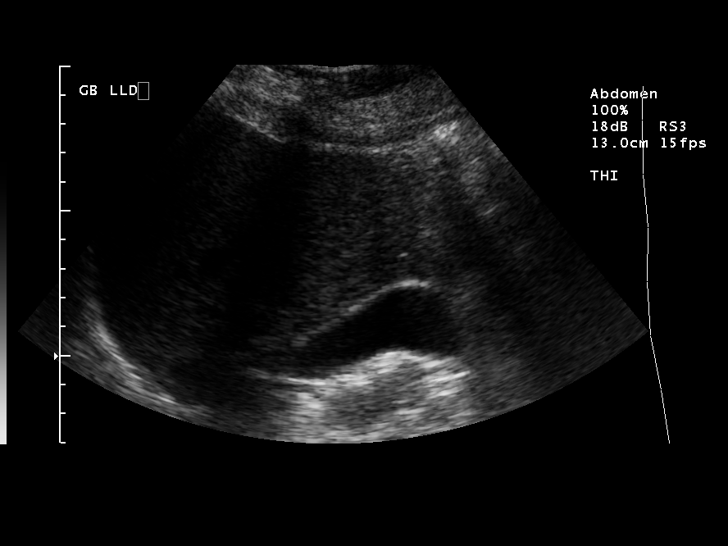
[im 38/51]
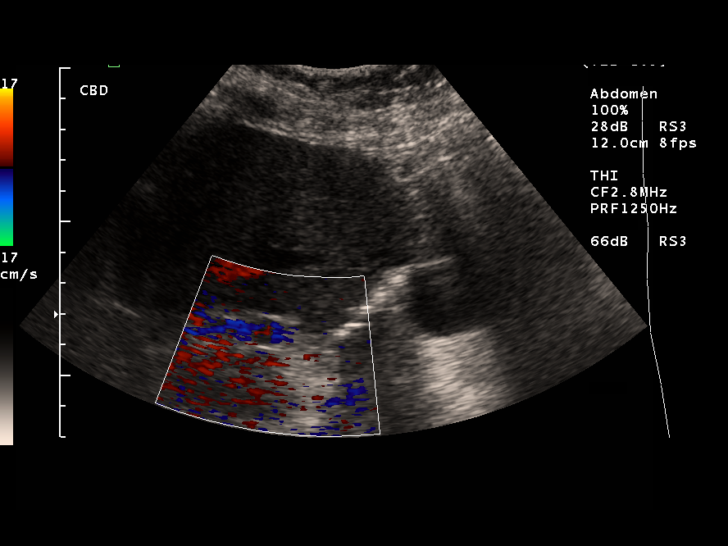
[im 42/51]
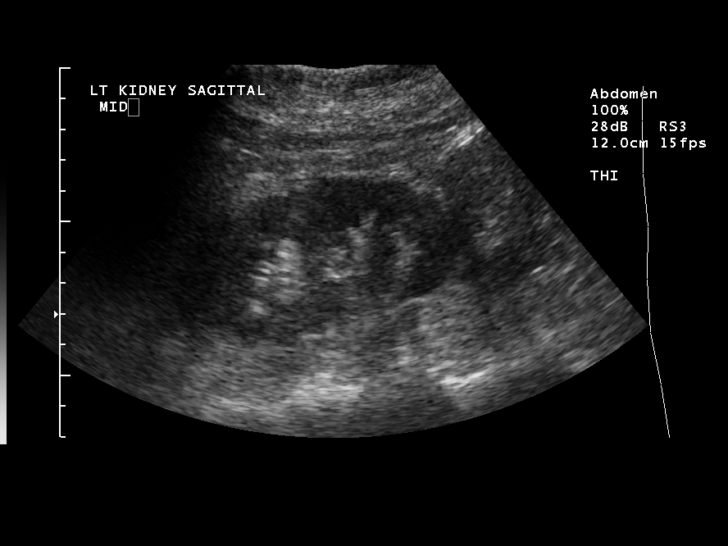
[im 46/51]
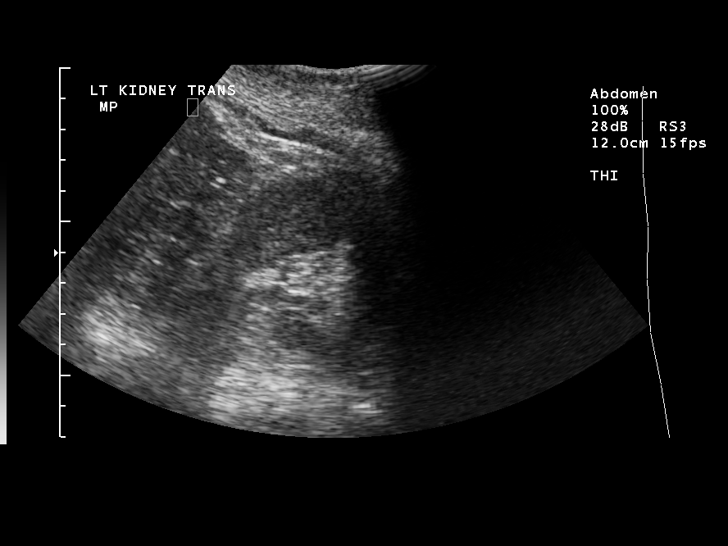
[im 51/51]
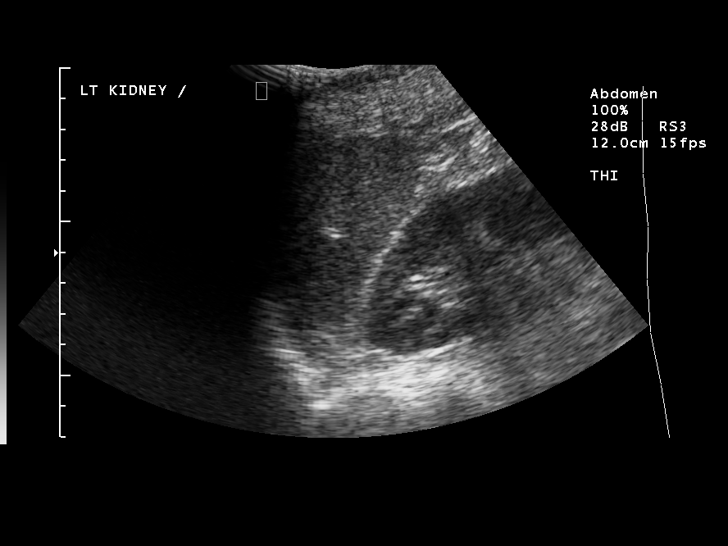

[13 of 25 positions shown; findings below may reference images not displayed]

FINDINGS: Gallbladder: Well distended without wall thickening, stones or
pericholecystic fluid. Negative Murphy's sign.

Common bile duct:   Normal in caliber without filling defects.

Liver:  Echogenicity is within normal limits.  No focal hepatic
abnormalities are identified.

IVC: Visualized portions appear unremarkable.

Pancreas:  Visualized portions appear unremarkable.The pancreatic
head is poorly visualized due to overlying bowel gas.

Spleen:  Visualized portions appear unremarkable.

Right Kidney:  The renal cortical thickness and echogenicity are
preserved.  There is no hydronephrosis or focal abnormality. Renal
length is 11.4 cm.

Left Kidney:  The renal cortical thickness and echogenicity are
preserved.  There is no hydronephrosis or focal abnormality. Renal
length is 10.0 cm.

Abdominal aorta:  Visualized portions appear unremarkable.

A small right pleural effusion is noted.
IMPRESSION: 1.  No acute abdominal findings.
2.  Limited visualization of the pancreatic head and distal
abdominal aorta.
3.  Small right pleural effusion.

## 2010-11-06 ENCOUNTER — Encounter: Payer: Self-pay | Admitting: Family Medicine

## 2010-11-07 ENCOUNTER — Encounter: Payer: Self-pay | Admitting: Family Medicine

## 2010-11-13 LAB — CONVERTED CEMR LAB
Cholesterol, target level: 200 mg/dL
LDL Goal: 160 mg/dL

## 2011-01-08 LAB — BASIC METABOLIC PANEL
CO2: 29 mEq/L (ref 19–32)
Calcium: 9.3 mg/dL (ref 8.4–10.5)
GFR calc Af Amer: >60 mL/min (ref >60)
GFR calc non Af Amer: 57 mL/min — ABNORMAL LOW (ref >60)
Potassium: 4 mEq/L (ref 3.5–5.1)
Sodium: 142 mEq/L (ref 135–145)

## 2011-01-08 LAB — RAPID URINE DRUG SCREEN, HOSP PERFORMED
Opiates: POSITIVE — AB
Tetrahydrocannabinol: NOT DETECTED

## 2011-01-08 LAB — CBC
HCT: 42.8 % (ref 36.0–46.0)
Hemoglobin: 14.9 g/dL (ref 12.0–15.0)
MCHC: 34.9 g/dL (ref 30.0–36.0)
RBC: 4.76 MIL/uL (ref 3.87–5.11)

## 2011-01-08 LAB — DIFFERENTIAL
Lymphocytes Relative: 51 % — ABNORMAL HIGH (ref 12–46)
Monocytes Absolute: 0.4 10*3/uL (ref 0.1–1.0)
Monocytes Relative: 8 % (ref 3–12)
Neutro Abs: 1.8 10*3/uL (ref 1.7–7.7)

## 2011-01-19 LAB — COMPREHENSIVE METABOLIC PANEL
ALT: 44 U/L — ABNORMAL HIGH (ref 0–35)
ALT: 47 U/L — ABNORMAL HIGH (ref 0–35)
AST: 30 U/L (ref 0–37)
Albumin: 2.7 g/dL — ABNORMAL LOW (ref 3.5–5.2)
Albumin: 3.5 g/dL (ref 3.5–5.2)
Alkaline Phosphatase: 86 U/L (ref 39–117)
Alkaline Phosphatase: 91 U/L (ref 39–117)
BUN: 4 mg/dL — ABNORMAL LOW (ref 6–23)
BUN: 10 mg/dL (ref 6–23)
BUN: 17 mg/dL (ref 6–23)
CO2: 25 mEq/L (ref 19–32)
CO2: 20 mEq/L (ref 19–32)
Calcium: 8.7 mg/dL (ref 8.4–10.5)
Calcium: 7.5 mg/dL — ABNORMAL LOW (ref 8.4–10.5)
Calcium: 8.7 mg/dL (ref 8.4–10.5)
Chloride: 101 mEq/L (ref 96–112)
Chloride: 115 mEq/L — ABNORMAL HIGH (ref 96–112)
Creatinine, Ser: 0.72 mg/dL (ref 0.4–1.2)
Creatinine, Ser: 0.69 mg/dL (ref 0.4–1.2)
GFR calc Af Amer: >60 mL/min (ref >60)
GFR calc non Af Amer: >60 mL/min (ref >60)
Glucose, Bld: 85 mg/dL (ref 70–99)
Glucose, Bld: 127 mg/dL — ABNORMAL HIGH (ref 70–99)
Potassium: 3.6 mEq/L (ref 3.5–5.1)
Potassium: 4 mEq/L (ref 3.5–5.1)
Sodium: 140 mEq/L (ref 135–145)
Total Bilirubin: 0.5 mg/dL (ref 0.3–1.2)
Total Protein: 5.9 g/dL — ABNORMAL LOW (ref 6.0–8.3)

## 2011-01-19 LAB — RAPID URINE DRUG SCREEN, HOSP PERFORMED
Amphetamines: NOT DETECTED
Barbiturates: NOT DETECTED
Barbiturates: NOT DETECTED
Benzodiazepines: POSITIVE — AB
Cocaine: POSITIVE — AB
Opiates: NOT DETECTED
Tetrahydrocannabinol: NOT DETECTED

## 2011-01-19 LAB — HEPATIC FUNCTION PANEL
ALT: 46 U/L — ABNORMAL HIGH (ref 0–35)
AST: 22 U/L (ref 0–37)
Albumin: 3.1 g/dL — ABNORMAL LOW (ref 3.5–5.2)
Alkaline Phosphatase: 80 U/L (ref 39–117)
Total Protein: 5.1 g/dL — ABNORMAL LOW (ref 6.0–8.3)

## 2011-01-19 LAB — BASIC METABOLIC PANEL
CO2: 28 mEq/L (ref 19–32)
Calcium: 9.4 mg/dL (ref 8.4–10.5)
Chloride: 101 mEq/L (ref 96–112)
Chloride: 104 mEq/L (ref 96–112)
GFR calc Af Amer: >60 mL/min (ref >60)
GFR calc non Af Amer: >60 mL/min (ref >60)
Glucose, Bld: 103 mg/dL — ABNORMAL HIGH (ref 70–99)
Potassium: 3.8 mEq/L (ref 3.5–5.1)
Potassium: 4.2 mEq/L (ref 3.5–5.1)
Sodium: 136 mEq/L (ref 135–145)
Sodium: 138 mEq/L (ref 135–145)

## 2011-01-19 LAB — DIFFERENTIAL
Basophils Absolute: 0 10*3/uL (ref 0.0–0.1)
Basophils Absolute: 0 10*3/uL (ref 0.0–0.1)
Basophils Relative: 1 % (ref 0–1)
Basophils Relative: 1 % (ref 0–1)
Eosinophils Absolute: 0.2 10*3/uL (ref 0.0–0.7)
Eosinophils Absolute: 0.2 10*3/uL (ref 0.0–0.7)
Eosinophils Relative: 4 % (ref 0–5)
Eosinophils Relative: 4 % (ref 0–5)
Lymphocytes Relative: 48 % — ABNORMAL HIGH (ref 12–46)
Lymphocytes Relative: 43 % (ref 12–46)
Lymphocytes Relative: 42 % (ref 12–46)
Lymphocytes Relative: 43 % (ref 12–46)
Lymphs Abs: 2.3 10*3/uL (ref 0.7–4.0)
Lymphs Abs: 1.8 10*3/uL (ref 0.7–4.0)
Lymphs Abs: 2 10*3/uL (ref 0.7–4.0)
Monocytes Absolute: 0.5 10*3/uL (ref 0.1–1.0)
Monocytes Absolute: 0.5 10*3/uL (ref 0.1–1.0)
Monocytes Absolute: 0.6 10*3/uL (ref 0.1–1.0)
Monocytes Relative: 15 % — ABNORMAL HIGH (ref 3–12)
Neutro Abs: 2.4 10*3/uL (ref 1.7–7.7)
Neutro Abs: 1.5 10*3/uL — ABNORMAL LOW (ref 1.7–7.7)
Neutro Abs: 2.3 10*3/uL (ref 1.7–7.7)
Neutrophils Relative %: 35 % — ABNORMAL LOW (ref 43–77)
Neutrophils Relative %: 45 % (ref 43–77)
Neutrophils Relative %: 36 % — ABNORMAL LOW (ref 43–77)
Neutrophils Relative %: 56 % (ref 43–77)

## 2011-01-19 LAB — SALICYLATE LEVEL
Salicylate Lvl: <4 mg/dL (ref 2.8–20.0)
Salicylate Lvl: <4 mg/dL (ref 2.8–20.0)

## 2011-01-19 LAB — PROTIME-INR
INR: 1 (ref 0.00–1.49)
Prothrombin Time: 13.1 seconds (ref 11.6–15.2)

## 2011-01-19 LAB — CBC
HCT: 38.7 % (ref 36.0–46.0)
HCT: 34.9 % — ABNORMAL LOW (ref 36.0–46.0)
HCT: 40 % (ref 36.0–46.0)
Hemoglobin: 12.1 g/dL (ref 12.0–15.0)
Hemoglobin: 14.2 g/dL (ref 12.0–15.0)
MCHC: 34.6 g/dL (ref 30.0–36.0)
MCHC: 34.1 g/dL (ref 30.0–36.0)
MCHC: 34.7 g/dL (ref 30.0–36.0)
MCV: 91.8 fL (ref 78.0–100.0)
MCV: 92.1 fL (ref 78.0–100.0)
Platelets: 301 10*3/uL (ref 150–400)
Platelets: 222 10*3/uL (ref 150–400)
Platelets: 281 10*3/uL (ref 150–400)
RBC: 3.81 MIL/uL — ABNORMAL LOW (ref 3.87–5.11)
RBC: 3.61 MIL/uL — ABNORMAL LOW (ref 3.87–5.11)
RDW: 14.1 % (ref 11.5–15.5)
RDW: 14.4 % (ref 11.5–15.5)
WBC: 4.2 10*3/uL (ref 4.0–10.5)
WBC: 4.7 10*3/uL (ref 4.0–10.5)

## 2011-01-19 LAB — URINALYSIS, ROUTINE W REFLEX MICROSCOPIC
Glucose, UA: NEGATIVE mg/dL
Hgb urine dipstick: NEGATIVE
Protein, ur: NEGATIVE mg/dL
Protein, ur: NEGATIVE mg/dL
Urobilinogen, UA: 0.2 mg/dL (ref 0.0–1.0)
Urobilinogen, UA: 0.2 mg/dL (ref 0.0–1.0)

## 2011-01-19 LAB — URINE MICROSCOPIC-ADD ON

## 2011-01-19 LAB — CARDIAC PANEL(CRET KIN+CKTOT+MB+TROPI)
CK, MB: 0.5 ng/mL (ref 0.3–4.0)
Total CK: 45 U/L (ref 7–177)

## 2011-01-19 LAB — URINALYSIS, MICROSCOPIC ONLY
Glucose, UA: NEGATIVE mg/dL
Hgb urine dipstick: NEGATIVE
Leukocytes, UA: NEGATIVE
Protein, ur: NEGATIVE mg/dL
Specific Gravity, Urine: 1.01 (ref 1.005–1.030)

## 2011-01-19 LAB — ETHANOL
Alcohol, Ethyl (B): <5 mg/dL (ref 0–10)
Alcohol, Ethyl (B): <5 mg/dL (ref 0–10)

## 2011-01-19 LAB — POCT I-STAT, CHEM 8
BUN: 13 mg/dL (ref 6–23)
Potassium: 4 mEq/L (ref 3.5–5.1)
Sodium: 138 mEq/L (ref 135–145)
TCO2: 29 mmol/L (ref 0–100)

## 2011-01-20 LAB — URINALYSIS, ROUTINE W REFLEX MICROSCOPIC
Bilirubin Urine: NEGATIVE
Glucose, UA: NEGATIVE mg/dL
Hgb urine dipstick: NEGATIVE
Ketones, ur: NEGATIVE mg/dL
Nitrite: NEGATIVE
Protein, ur: NEGATIVE mg/dL
Specific Gravity, Urine: 1.011 (ref 1.005–1.030)
Urobilinogen, UA: 0.2 mg/dL (ref 0.0–1.0)
pH: 6.5 (ref 5.0–8.0)

## 2011-01-20 LAB — URINE MICROSCOPIC-ADD ON

## 2011-01-20 LAB — DIFFERENTIAL
Basophils Absolute: 0 10*3/uL (ref 0.0–0.1)
Lymphocytes Relative: 32 % (ref 12–46)
Neutro Abs: 3.6 10*3/uL (ref 1.7–7.7)

## 2011-01-20 LAB — BASIC METABOLIC PANEL WITH GFR
BUN: 6 mg/dL (ref 6–23)
CO2: 27 meq/L (ref 19–32)
Calcium: 9.5 mg/dL (ref 8.4–10.5)
Chloride: 107 meq/L (ref 96–112)
Creatinine, Ser: 0.65 mg/dL (ref 0.4–1.2)
GFR calc non Af Amer: >60 mL/min
Glucose, Bld: 83 mg/dL (ref 70–99)
Potassium: 3.8 meq/L (ref 3.5–5.1)
Sodium: 139 meq/L (ref 135–145)

## 2011-01-20 LAB — RAPID URINE DRUG SCREEN, HOSP PERFORMED
Barbiturates: NOT DETECTED
Benzodiazepines: NOT DETECTED
Cocaine: NOT DETECTED

## 2011-01-20 LAB — TSH: TSH: 1.532 u[IU]/mL (ref 0.350–4.500)

## 2011-01-20 LAB — ETHANOL

## 2011-01-20 LAB — CBC
Hemoglobin: 15.6 g/dL — ABNORMAL HIGH (ref 12.0–15.0)
Platelets: 275 10*3/uL (ref 150–400)
RDW: 13.2 % (ref 11.5–15.5)

## 2011-01-25 LAB — BASIC METABOLIC PANEL
BUN: 7 mg/dL (ref 6–23)
CO2: 30 mEq/L (ref 19–32)
CO2: 28 mEq/L (ref 19–32)
CO2: 25 mEq/L (ref 19–32)
Calcium: 9.5 mg/dL (ref 8.4–10.5)
Chloride: 101 mEq/L (ref 96–112)
Chloride: 109 mEq/L (ref 96–112)
Creatinine, Ser: 0.82 mg/dL (ref 0.4–1.2)
Creatinine, Ser: 0.62 mg/dL (ref 0.4–1.2)
GFR calc Af Amer: >60 mL/min (ref >60)
GFR calc Af Amer: >60 mL/min (ref >60)
GFR calc Af Amer: >60 mL/min (ref >60)
GFR calc non Af Amer: >60 mL/min (ref >60)
Glucose, Bld: 99 mg/dL (ref 70–99)
Glucose, Bld: 90 mg/dL (ref 70–99)
Potassium: 3.8 mEq/L (ref 3.5–5.1)
Potassium: 3.7 mEq/L (ref 3.5–5.1)
Potassium: 4 mEq/L (ref 3.5–5.1)
Sodium: 136 mEq/L (ref 135–145)

## 2011-01-25 LAB — URINALYSIS, ROUTINE W REFLEX MICROSCOPIC
Bilirubin Urine: NEGATIVE
Glucose, UA: NEGATIVE mg/dL
Hgb urine dipstick: NEGATIVE
Hgb urine dipstick: NEGATIVE
Ketones, ur: NEGATIVE mg/dL
Nitrite: NEGATIVE
Protein, ur: NEGATIVE mg/dL
Specific Gravity, Urine: 1.007 (ref 1.005–1.030)
Specific Gravity, Urine: 1.025 (ref 1.005–1.030)
Urobilinogen, UA: 0.2 mg/dL (ref 0.0–1.0)
Urobilinogen, UA: 0.2 mg/dL (ref 0.0–1.0)

## 2011-01-25 LAB — URINE MICROSCOPIC-ADD ON

## 2011-01-25 LAB — POCT I-STAT, CHEM 8
Calcium, Ion: 1.12 mmol/L (ref 1.12–1.32)
Chloride: 104 mEq/L (ref 96–112)
HCT: 40 % (ref 36.0–46.0)
Potassium: 3.4 mEq/L — ABNORMAL LOW (ref 3.5–5.1)

## 2011-01-25 LAB — CBC
HCT: 42.8 % (ref 36.0–46.0)
HCT: 40.2 % (ref 36.0–46.0)
Hemoglobin: 14.3 g/dL (ref 12.0–15.0)
Hemoglobin: 14.7 g/dL (ref 12.0–15.0)
Hemoglobin: 13.9 g/dL (ref 12.0–15.0)
MCHC: 34.4 g/dL (ref 30.0–36.0)
MCHC: 34.3 g/dL (ref 30.0–36.0)
MCHC: 34.5 g/dL (ref 30.0–36.0)
MCV: 91.9 fL (ref 78.0–100.0)
MCV: 92.3 fL (ref 78.0–100.0)
MCV: 91.8 fL (ref 78.0–100.0)
RBC: 4.64 MIL/uL (ref 3.87–5.11)
RBC: 4.38 MIL/uL (ref 3.87–5.11)
RDW: 14 % (ref 11.5–15.5)
RDW: 14 % (ref 11.5–15.5)
WBC: 7.4 10*3/uL (ref 4.0–10.5)

## 2011-01-25 LAB — RAPID URINE DRUG SCREEN, HOSP PERFORMED
Barbiturates: NOT DETECTED
Opiates: POSITIVE — AB

## 2011-01-25 LAB — DIFFERENTIAL
Basophils Absolute: 0.1 10*3/uL (ref 0.0–0.1)
Basophils Absolute: 0 10*3/uL (ref 0.0–0.1)
Basophils Relative: 2 % — ABNORMAL HIGH (ref 0–1)
Basophils Relative: 0 % (ref 0–1)
Eosinophils Absolute: 0.4 10*3/uL (ref 0.0–0.7)
Eosinophils Absolute: 0.2 10*3/uL (ref 0.0–0.7)
Eosinophils Relative: 2 % (ref 0–5)
Eosinophils Relative: 2 % (ref 0–5)
Lymphs Abs: 1.4 10*3/uL (ref 0.7–4.0)
Monocytes Absolute: 0.7 10*3/uL (ref 0.1–1.0)
Monocytes Absolute: 0.5 10*3/uL (ref 0.1–1.0)
Monocytes Absolute: 0.6 10*3/uL (ref 0.1–1.0)
Monocytes Relative: 6 % (ref 3–12)
Monocytes Relative: 8 % (ref 3–12)
Neutro Abs: 2.7 10*3/uL (ref 1.7–7.7)
Neutrophils Relative %: 44 % (ref 43–77)
Neutrophils Relative %: 70 % (ref 43–77)

## 2011-01-25 LAB — CLOSTRIDIUM DIFFICILE EIA
C difficile Toxins A+B, EIA: NEGATIVE
C difficile Toxins A+B, EIA: NEGATIVE
C difficile Toxins A+B, EIA: NEGATIVE

## 2011-01-25 LAB — OVA AND PARASITE EXAMINATION

## 2011-01-25 LAB — COMPREHENSIVE METABOLIC PANEL
AST: 24 U/L (ref 0–37)
CO2: 29 mEq/L (ref 19–32)
Chloride: 103 mEq/L (ref 96–112)
Creatinine, Ser: 0.95 mg/dL (ref 0.4–1.2)
GFR calc Af Amer: >60 mL/min (ref >60)
GFR calc non Af Amer: >60 mL/min (ref >60)
Glucose, Bld: 111 mg/dL — ABNORMAL HIGH (ref 70–99)
Total Bilirubin: 0.7 mg/dL (ref 0.3–1.2)

## 2011-01-25 LAB — STOOL CULTURE

## 2011-01-25 LAB — ETHANOL: Alcohol, Ethyl (B): <5 mg/dL (ref 0–10)

## 2011-01-26 LAB — DIFFERENTIAL
Basophils Absolute: 0 10*3/uL (ref 0.0–0.1)
Basophils Absolute: 0 10*3/uL (ref 0.0–0.1)
Basophils Absolute: 0.1 10*3/uL (ref 0.0–0.1)
Basophils Relative: 0 % (ref 0–1)
Basophils Relative: 0 % (ref 0–1)
Basophils Relative: 1 % (ref 0–1)
Eosinophils Absolute: 0 10*3/uL (ref 0.0–0.7)
Eosinophils Absolute: 0 10*3/uL (ref 0.0–0.7)
Eosinophils Absolute: 0.1 10*3/uL (ref 0.0–0.7)
Eosinophils Absolute: 0.3 10*3/uL (ref 0.0–0.7)
Eosinophils Relative: 0 % (ref 0–5)
Eosinophils Relative: 2 % (ref 0–5)
Eosinophils Relative: 3 % (ref 0–5)
Lymphocytes Relative: 5 % — ABNORMAL LOW (ref 12–46)
Lymphocytes Relative: 5 % — ABNORMAL LOW (ref 12–46)
Lymphs Abs: 0.5 10*3/uL — ABNORMAL LOW (ref 0.7–4.0)
Lymphs Abs: 2.4 10*3/uL (ref 0.7–4.0)
Monocytes Absolute: 0.2 10*3/uL (ref 0.1–1.0)
Monocytes Absolute: 0.4 10*3/uL (ref 0.1–1.0)
Monocytes Absolute: 0.4 10*3/uL (ref 0.1–1.0)
Monocytes Relative: 1 % — ABNORMAL LOW (ref 3–12)
Monocytes Relative: 8 % (ref 3–12)
Neutro Abs: 10.3 10*3/uL — ABNORMAL HIGH (ref 1.7–7.7)
Neutro Abs: 9.3 10*3/uL — ABNORMAL HIGH (ref 1.7–7.7)
Neutrophils Relative %: 90 % — ABNORMAL HIGH (ref 43–77)
Neutrophils Relative %: 94 % — ABNORMAL HIGH (ref 43–77)

## 2011-01-26 LAB — URINALYSIS, ROUTINE W REFLEX MICROSCOPIC
Glucose, UA: NEGATIVE mg/dL
Glucose, UA: NEGATIVE mg/dL
Hgb urine dipstick: NEGATIVE
Ketones, ur: NEGATIVE mg/dL
Protein, ur: NEGATIVE mg/dL
Protein, ur: NEGATIVE mg/dL

## 2011-01-26 LAB — COMPREHENSIVE METABOLIC PANEL
AST: 17 U/L (ref 0–37)
Albumin: 4.2 g/dL (ref 3.5–5.2)
Alkaline Phosphatase: 85 U/L (ref 39–117)
Chloride: 100 mEq/L (ref 96–112)
GFR calc Af Amer: >60 mL/min (ref >60)
Potassium: 3.6 mEq/L (ref 3.5–5.1)
Total Bilirubin: 0.5 mg/dL (ref 0.3–1.2)

## 2011-01-26 LAB — CBC
HCT: 37.6 % (ref 36.0–46.0)
HCT: 45.6 % (ref 36.0–46.0)
HCT: 45.7 % (ref 36.0–46.0)
Hemoglobin: 12.6 g/dL (ref 12.0–15.0)
Hemoglobin: 13.5 g/dL (ref 12.0–15.0)
Hemoglobin: 15.4 g/dL — ABNORMAL HIGH (ref 12.0–15.0)
MCHC: 33.5 g/dL (ref 30.0–36.0)
MCHC: 34.9 g/dL (ref 30.0–36.0)
MCV: 90.7 fL (ref 78.0–100.0)
MCV: 91 fL (ref 78.0–100.0)
Platelets: 221 10*3/uL (ref 150–400)
Platelets: 237 10*3/uL (ref 150–400)
Platelets: 274 10*3/uL (ref 150–400)
RBC: 4.31 MIL/uL (ref 3.87–5.11)
RBC: 5.01 MIL/uL (ref 3.87–5.11)
RBC: 5.13 MIL/uL — ABNORMAL HIGH (ref 3.87–5.11)
RDW: 13.5 % (ref 11.5–15.5)
WBC: 10.3 10*3/uL (ref 4.0–10.5)
WBC: 10.9 10*3/uL — ABNORMAL HIGH (ref 4.0–10.5)
WBC: 5.7 10*3/uL (ref 4.0–10.5)
WBC: 9.7 10*3/uL (ref 4.0–10.5)

## 2011-01-26 LAB — RAPID URINE DRUG SCREEN, HOSP PERFORMED
Barbiturates: NOT DETECTED
Benzodiazepines: POSITIVE — AB

## 2011-01-26 LAB — BASIC METABOLIC PANEL
BUN: 6 mg/dL (ref 6–23)
BUN: 6 mg/dL (ref 6–23)
BUN: 8 mg/dL (ref 6–23)
CO2: 26 mEq/L (ref 19–32)
Calcium: 9.2 mg/dL (ref 8.4–10.5)
Chloride: 100 mEq/L (ref 96–112)
Creatinine, Ser: 0.53 mg/dL (ref 0.4–1.2)
GFR calc Af Amer: >60 mL/min (ref >60)
GFR calc non Af Amer: >60 mL/min (ref >60)
GFR calc non Af Amer: >60 mL/min (ref >60)
Glucose, Bld: 135 mg/dL — ABNORMAL HIGH (ref 70–99)
Glucose, Bld: 171 mg/dL — ABNORMAL HIGH (ref 70–99)
Potassium: 3.4 mEq/L — ABNORMAL LOW (ref 3.5–5.1)
Potassium: 4.1 mEq/L (ref 3.5–5.1)
Potassium: 4.1 mEq/L (ref 3.5–5.1)
Sodium: 144 mEq/L (ref 135–145)

## 2011-01-26 LAB — POCT CARDIAC MARKERS
CKMB, poc: <1 ng/mL — ABNORMAL LOW (ref 1.0–8.0)
CKMB, poc: <1 ng/mL — ABNORMAL LOW (ref 1.0–8.0)
Troponin i, poc: <0.05 ng/mL (ref 0.00–0.09)

## 2011-01-26 LAB — BENZODIAZEPINE, QUANTITATIVE, URINE: Oxazepam GC/MS Conf: 400 ng/mL

## 2011-01-26 LAB — DRUGS OF ABUSE SCREEN W/O ALC, ROUTINE URINE
Amphetamine Screen, Ur: NEGATIVE
Barbiturate Quant, Ur: NEGATIVE
Cocaine Metabolites: NEGATIVE
Marijuana Metabolite: NEGATIVE
Opiate Screen, Urine: POSITIVE — AB
Phencyclidine (PCP): NEGATIVE

## 2011-01-26 LAB — ETHANOL: Alcohol, Ethyl (B): <5 mg/dL (ref 0–10)

## 2011-01-26 LAB — CARDIAC PANEL(CRET KIN+CKTOT+MB+TROPI)
CK, MB: 1.2 ng/mL (ref 0.3–4.0)
CK, MB: 1.7 ng/mL (ref 0.3–4.0)
Relative Index: INVALID (ref 0.0–2.5)
Relative Index: INVALID (ref 0.0–2.5)
Total CK: 81 U/L (ref 7–177)

## 2011-01-26 LAB — D-DIMER, QUANTITATIVE: D-Dimer, Quant: 0.22 ug/mL-FEU (ref 0.00–0.48)

## 2011-01-26 LAB — OPIATE, QUANTITATIVE, URINE
Codeine Urine: NEGATIVE ng/mL
Hydromorphone GC/MS Conf: 130 ng/mL
Oxymorphone: NEGATIVE ng/mL

## 2011-02-28 NOTE — H&P (Signed)
[name], [name]               ACCOUNT NO.:  [id number]   MEDICAL RECORD NO.:  [id number]          PATIENT TYPE:  INP   LOCATION:  A223                          FACILITY:  [facility]   PHYSICIAN:  [name], DO    DATE OF BIRTH:  12/05/1955   DATE OF ADMISSION:  01/04/2008  DATE OF DISCHARGE:  LH                              HISTORY & PHYSICAL   REASON FOR ADMISSION:  The patient is a 55 year old female who is  admitted to the service of [facility] for salicylate overdose.   HISTORY OF THE PRESENT ILLNESS:  The patient was brought to the  emergency room via a private vehicle by her family.  It was found that  she had taken an overdose of aspirin and Celexa today stating that she  was apparently in a suicidal attempt.  The patient also admits to having  previous suicidal attempts, in which she slit her wrist, and also took  benzodiazepines.  She has had multiple hospitalizations at [facility] through the [facility] system.   PAST MEDICAL HISTORY:  The patient's past medical history is positive  for:  1. Chronic back pain.  2. Depression.  3. Hernia.  4. Suicide attempts.  5. Migraine headaches.   PAST SURGICAL HISTORY:  1. Four bladder suspensions.  2. Hernia repair.  3. Hysterectomy.   SOCIAL HISTORY:  The patient is a nondrinker.  The patient smokes one  pack of cigarettes and currently uses cocaine about twice a month.  She  also tested positive for cocaine in the ED.   ALLERGIES:  No known drug allergies.   MEDICATIONS:  Of note, we have very few dosages:  1. Celexa; there is no dose given.  2. Diazepam 5 mg twice a day.  3. Remeron; no dose given.  4. Vicodin 7.5 milligrams; no frequency given.  5. Ambien; no dose or frequency given.   REVIEW OF SYSTEMS:  CONSTITUTIONAL:  Negative of for fever and chills.  EYES:  Negative for eye pain, blurred vision or visual changes.  EARS,  NOSE, MOUTH AND THROAT:  Negative for ear pain, epistaxis and  hoarseness.   CARDIOVASCULAR:  Negative for chest pain and dyspnea.  RESPIRATORY:  Negative for cough, dyspnea and wheezing.  GASTROINTESTINAL:  Negative for nausea, vomiting and abdominal pain.  GENITOURINARY:  Negative for dysuria, flank pain or frequency.  INTEGUMENT:  Negative for pruritus or rash.  NEUROLOGIC:  Negative for  confusion, weakness or altered mental status.  PSYCHIATRIC:  Positive  for anxiety/depression and suicidal ideation.  Negative for behavioral  change.   PHYSICAL EXAMINATION:  VITAL SIGNS:  The patient's vital signs are as  follows; temperature 97.1, pulse 72, respirations 20 and blood pressure  110/71.  GENERAL APPEARANCE:  On physical exam the patient is a thin-appearing  woman who looks older than her stated age.  She is mildly anxious.  HEENT:  The head is normocephalic and atraumatic.  Eyes; normal  appearance.  PERRLA.  No scleral icterus.  EOMI.  Mouth and throat; the  teeth are in poor repair.  Positive remnants of charcoal  noted.  NECK:  The neck is supple.  No lymphadenopathy.  ABDOMEN:  The abdomen is soft, nontender and nondistended.  HEART:  The heart has a regular rate and rhythm.  LUNGS:  The lungs are clear to auscultation bilaterally.  No rales,  wheezes or rhonchi.  NEUROLOGIC EXAMINATION:  Alert and oriented times three.  Cranial nerves  II-XII are grossly intact.  PSYCHIATRIC:  The patient currently is anxious in appearance.   ADDITIONAL HISTORY:  It was noted, while the patient was in the ED, that  she was going to be cleared to be sent to [facility].  Upon the  second drawing of the salicylate levels it was found to be elevated at  37 from 34.  At that point in time it was determined that she could not  be transferred if her salicylate level went up; however, if it had gone  down from the previous level they would have accepted her.  It was  determined that the patient should be admitted to the service of  [facility].   LABORATORY DATA:  The  patient's labs are as follows; salicylate level  the first taken at 16:50 was 34.9 and then again at 18:12 it was 37.  Her metabolic panel revealed; sodium 141, potassium 3.6, chloride 208,  carbon dioxide 27, glucose 90, BUN 3, and creatinine 0.85.  Alcohol  level is less than 5.  Acetaminophen level is less than 10.  Her urine  is negative for any abnormalities.  Her urine drug screen is positive  for cocaine and benzodiazepines.  Her CBC with diff; white count 7.0,  hemoglobin 15.5, hematocrit 44.4 and platelet count 435,000.   ASSESSMENT AND PLAN:  1. Salicylate overdose.  2. Suicide attempt.  3. Cocaine use.   Plan:  1. We will admit the patient to the service of [facility].  Currently      her electrolytes are within normal limits; we will continue to      monitor her and recheck them again.  2. Also, we will have the ACT team come and reevaluate her.  3. We will do a repeat salicylate level at 12 midnight and repeat it      in the morning as well.  4. We will increase her oral intake.  5. The patient is complaining of extreme anxiety, so we will go ahead      and give her 1 mg of Ativan every 6 hours as needed.   I discussed at length with the patient regarding the consequences for  her actions and that we would try to minimize any type of medication  administered, particularly those that are either narcotic or  benzodiazepines as her salicylate level is coming down.  The patient  states understanding and agrees to be cooperative and work towards that  goal.  We will continue to monitor her and change therapy as necessary.      [name], DO  Electronically Signed     CB/MEDQ  D:  01/05/2008  T:  01/05/2008  Job:  [id number]

## 2011-02-28 NOTE — H&P (Signed)
[name], [name] NO.:  [id number]   MEDICAL RECORD NO.:  [id number]          PATIENT TYPE:  IPS   LOCATION:  0403                          FACILITY:  [facility]   PHYSICIAN:  Anselm Jungling, MD  DATE OF BIRTH:  [birth year]   DATE OF ADMISSION:  06/03/2008  DATE OF DISCHARGE:                       PSYCHIATRIC ADMISSION ASSESSMENT   IDENTIFY INFORMATION:  This is a 55 year old Caucasian female.  She is  single.  This is a voluntary admission.   HISTORY OF THE PRESENT ILLNESS:  [name] presented on August 16th to the  emergency room after cutting her left wrist, requiring several sutures  and overdosing on aspirin.  She was admitted to our medical unit for  stabilization, and please see the discharge summary dictated by Dr.  [name], stabilized and transferred to inpatient psychiatry on  August 19th.  [name] reports that she overdosed on aspirin and cut  herself because her daughter refused to allow her to come to a family  birthday party on the day of her birthday August 16th and her niece's  birthday also on the same day.  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  She endorses that this is a long-term chronic  conflict between her and her daughter over the care of the  grandchildren, and for quite a period of time she had not been allowed  to see the grandchildren at all.  Today she reports that she  intentionally overdosed and intentionally cut her wrist because she  could not live with the thoughts that she was not allowed to see her  grandchildren.   PAST PSYCHIATRIC HISTORY:  Currently followed March 23 to January 16, 2008,  after taking an intentional overdose of aspirin along with Celexa with  intention to kill herself.  At that time she was also positive for  cocaine.  She was stabilized at that  time and diagnosed with mood  disorder NOS and cocaine abuse.  She has a long history of  benzodiazepine dependence and at that time was tapered with Librium and  discharged on a limited number of Librium 10 mg twice a day with a  tapering prescription.  She has a history of mood disorder with multiple  suicide attempts.  She denies a family history of mental illness or  substance abuse.   MEDICAL HISTORY:  The patient is currently followed for psychiatry by  Dr. Arlington Calix  and Dr. Jeralyn Bennett at [facility] for chronic  back pain.  Medical problems include a history of constipation, migraine  headaches, and chronic back pain.   CURRENT MEDICATIONS:  Obtained at the [facility] in [place] [id number] area code 336.  Current medications are Celexa dose unclear,  Vicodin 7.5/750 mg one tablet t.i.d. p.r.n. for severe back pain,  dispense #42 on August 10 and no refills, Valium 5 mg p.o. b.i.d.  dispense #60 on August 2 two refills available.  She was previously  prescribed Remeron 30 mg at bedtime.  Her compliance with Celexa and  Remeron is unclear.  She has endorsed that she has not had much of her  medications in the past two months.   DRUG ALLERGIES:  None.   POSITIVE PHYSICAL FINDINGS:  Physical exam was done on the medical unit  as noted in the record her along with diagnostic studies which were  significant:  Normal kidney function.  Her initial salicylate level was  32.7 and gradually resolved to normal levels.  Kidney function remained  normal.  Her urine drug screen was positive for benzodiazepines in the  emergency room on August 16 and negative for all other substances.   MENTAL STATUS EXAM:  A fully alert female, irritable affect, fully alert  and oriented times four.  Immediate and recent memory is mildly  impaired.  She seems to have difficulty remembering exactly what  medication she has been taking and when, gives a variable story but is  oriented times  four.  Mood irritable.  Insight poor.  Admitting  intentional overdose.  Speech mildly pressured.  No evidence of internal  distractions or psychosis.  Directable.  Has been cooperative with  staff.  Insisting on needing her Valium and Vicodin continued.   DISCHARGE DIAGNOSES:  AXIS I:       Mood disorder, not otherwise  specified.  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  AXIS II:    Deferred.  AXIS III:   Chronic back pain by history.  Status post salicylate  overdose.  AXIS IV:    Severe issues with family discord, chronic.  AXIS V:     Current 42.  Past year not known.   PLAN:  To voluntarily admit her, to give her a safe detox from the  Valium and alleviate her suicidal thinking.  We have her on a Librium  protocol to safely detox her off benzodiazepines.  We will not restart  her Vicodin at this time on the recommendation of internal medicine and  given the patient's history of poor impulse control and she appears to  be in no pain, has been up ambulatory today.  Meanwhile, we will give  her Skelaxin 800 mg t.i.d. and Naprosyn 500 mg b.i.d. for pain.  Estimated length of stay is five days.      Margaret A. Lorin Picket, N.P.      Anselm Jungling, MD  Electronically Signed    MAS/MEDQ  D:  06/04/2008  T:  06/04/2008  Job:  [id number]

## 2011-02-28 NOTE — Group Therapy Note (Signed)
[name], [name]               ACCOUNT NO.:  [id number]   MEDICAL RECORD NO.:  [id number]          PATIENT TYPE:  INP   LOCATION:  A322                          FACILITY:  [facility]   PHYSICIAN:  [name], DO    DATE OF BIRTH:  1955/12/20   DATE OF PROCEDURE:  01/07/2009  DATE OF DISCHARGE:                                 PROGRESS NOTE   SUBJECTIVE:  The patient is seen today for __________.  She has had  increasing coughing, having a difficult time breathing; however, she is  currently with an oxygen saturation of 97% on room air.  I talked to her  about coming into [facility].  She has mentioned to  the nurse on several occasions that her psychiatric medications are not  working for her.  I talked to her about being transferred to [facility] to the Medical Psych Unit, and she would like to stay  here.   PHYSICAL EXAMINATION:  VITAL SIGNS:  Temperature 98 degrees, pulse 105,  respirations 20, blood pressure 103/79.  GENERAL:  The patient is well-developed and well-nourished, in no acute  distress.  HEART:  Regular rate and rhythm.  LUNGS:  Positive for inspiratory and expiratory wheezes.  STOMACH:  Flat, nontender, nondistended.  EXTREMITIES:  Positive pulses.   LABORATORY DATA:  White count 10.7, hemoglobin 13.5, hematocrit 38.7,  platelets 248.  Sodium 140, potassium 4.1, chloride 107, CO2 of 24,  glucose 107, BUN 8, creatinine 0.82.   ASSESSMENT/PLAN:  1. Exacerbation of chronic obstructive pulmonary disease:  Will      continue her on steroids, increase antibiotics, incentive      spirometry.  2. Severe depression with suicide attempts:  The patient is not      suicidal at this point in time; however, she stated that her      current medications are not working.  At this point in time, we      will continue her on what she was on at home, which includes      Celexa, Librium and will have her follow up with her primary care  physician.  3. Hypertension:  Will continue to monitor this and will go ahead and      start her on a low dose of metoprolol.  Currently she is      tachycardic as well.  Will continue to monitor her tachycardia.      Will continue the patient on fluid boluses to see if this helps to      address it.  If not, will obtain an electrocardiogram; however, all      of her cardiac markers are negative at this point in time.  It also      could be due to infectious process.      [name], DO  Electronically Signed     CB/MEDQ  D:  01/07/2009  T:  01/07/2009  Job:  [phone]

## 2011-02-28 NOTE — Discharge Summary (Signed)
[name], [name] NO.:  [id number]   MEDICAL RECORD NO.:  [id number]          PATIENT TYPE:  IPS   LOCATION:  0406                          [facility]ACILITY:  [facility]   PHYSICIAN:  Anselm Jungling, MD  DATE OF BIRTH:  [birth year]   DATE OF ADMISSION:  12/28/2007  DATE OF DISCHARGE:  01/01/2008                               DISCHARGE SUMMARY   IDENTIFYING DATA AND REASON FOR ADMISSION:  This was an inpatient  psychiatric admission for [name], a 55 year old divorced white female who  was admitted in the aftermath of a suicide attempt by overdose.  She  also came to [place] with polysubstance abuse issues.  Please refer to the  admission note for further details pertaining to the symptoms,  circumstances and history that led to her hospitalization.  She was  given initial Axis I diagnoses of major depressive disorder, recurrent,  severe, cocaine abuse, and polydrug abuse.   MEDICAL AND LABORATORY:  1. The patient claimed a history of migraine and chronic back pain.      Otherwise, there were no acute medical issues.  2. Her primary care doctor is Dr. Fredirick Lathe.  3. She was given Robaxin for back muscle relaxation, and Voltaren, for      pain control.   HOSPITAL COURSE:  The patient was admitted to the adult inpatient  psychiatric service.  She presented as a well-nourished, normally-  developed adult female who had been on our unit in the past.  She was  initially evaluated by Geoffery Lyons, M.D.  There were no signs or  symptoms of psychosis.  She was very strongly geared towards medication  and somatic complaints.  She required one-to-one supervision at times.  There were concerns about suicide risk.   On the third hospital day, the undersigned assumed the patient's care.  On that date, I met with the patient individually, reviewed the  patient's chart, and discussed her care with nursing.  She was alert and  well-organized.  She was somewhat anxious and edgy, however.  She  asked  me specifically to prescribe her Vicodin and Xanax, both of which were  declined.  I made it clear to the patient that we would only use non-  addictive medications for pain.  This included a lidocaine patch, and  Voltaren EC, a nonsteroidal antiinflammatory drug.  Because the patient  had been using substantial amounts of benzodiazepines and opiates prior  to admission, we need to institute some withdrawal protocols, which were  helpful to the patient.   The following day, the patient was calmer and much less agitated, by  virtue of clonidine and Librium withdrawal protocols.  She was more  accepting of the notion of needing to be detoxed, and not being allowed  to take further benzodiazepines or opiates.  However, she still tended  to minimize substance abuse issues, stating my problem is depression.  She discussed the desire for counseling which was supported.   The following day, the patient indicated that she wanted to be  discharged.  It was explained to her that she was still experiencing  some degree of withdrawal.  However, she insisted upon discharge.  She  was discharged to [facility] with an appointment on  the same day as discharge to initiate outpatient treatment.  We  encouraged her to ask [facility] for a community  support worker.   DISCHARGE MEDICATIONS:  1. Celexa 20 mg twice daily.  2. Robaxin 750 mg q.i.d.  3. Voltaren EC 50 mg b.i.d.  4. Librium 25 mg b.i.d. on the day of discharge, followed by 25 mg the      day following, then discontinue Librium.   DISCHARGE DIAGNOSES:  AXIS I:  1. Substance abuse disorder, not otherwise specified.  2. Substance abuse dependence, withdrawal disorder, not otherwise      specified.  3. Substance-induced mood disorder.   AXIS II: Deferred.   AXIS III:  1. Chronic pain.  2. Migraine headaches.   AXIS IV: Stressors, severe.   AXIS V: GAF on discharge 55.      Anselm Jungling, MD  Electronically Signed     SPB/MEDQ  D:  01/30/2008  T:  01/30/2008  Job:  [phone]

## 2011-02-28 NOTE — H&P (Signed)
NAMEBENTLEY, HARALSON NO.:  [id number]   MEDICAL RECORD NO.:  [id number]          PATIENT TYPE:  IPS   LOCATION:  0504                          FACILITY:  [facility]   PHYSICIAN:  Geoffery Lyons, M.D.      DATE OF BIRTH:  [birth year]   DATE OF ADMISSION:  01/20/2009  DATE OF DISCHARGE:                       PSYCHIATRIC ADMISSION ASSESSMENT   DATE OF ASSESSMENT:  January 21, 2009 at 1335.   IDENTIFYING INFORMATION:  A 55 year old Caucasian female.  This is a  voluntary admission.   HISTORY OF PRESENT ILLNESS:  This is one of several admissions for [name]  who was discharged on January 15, 2009 after a 4-day stay on our unit for  problems with depression, hopelessness and suicidal thoughts.  She  returned to the emergency room on January 20, 2009, drowsy, staggering with  unstable gait and altered level of consciousness, saying that she had  taken some extra pills but was unable to say what she had taken.  She  said that she was discharged with the rest of her Librium taper and had  taken some Librium and not sure what else she had taken.  She said that  she had been unable to sleep, was getting very anxious and had relapsed  on cocaine one time.  She had had problems with her legs giving way and  said that she had fallen three times at home.  She reports that she has  been quite depressed since returning home and has admitted to some  suicidal thoughts with thoughts of overdosing on medication.  No  homicidal thoughts.   PAST PSYCHIATRIC HISTORY:  Currently followed by Dr. Betti Cruz at [facility] in [place], [place].  This is her 8th [facility]  admission since around 2005.  She does have a history of some alcohol  abuse, mood disorder, several overdose attempts, cocaine abuse which is  episodic.  She reports her longest period being abstinent from drugs and  alcohol is has 6 months prior to March 2010.  During that time, she was  able to maintain employment and  resume to caring for her grandchildren.  She reports her most recent relapse on alcohol and cocaine occurred in  March, and she reports that she has had unstable mood since January of  2010.   SOCIAL HISTORY:  This is a single female who has a daughter and several  grandchildren living in [place], [place].  Has also  been working at a [employer].  Had had several months of  stable employment and felt she was doing quite well until her mood began  to fluctuate in January.  No current legal charges.  Lives alone in her  own apartment.   FAMILY HISTORY:  Denies a family history of mental illness or substance  abuse.   MEDICAL HISTORY:  Primary care physician is Dr. Erby Pian.   CURRENT MEDICAL PROBLEMS:  COPD.  She is status post acute exacerbation  of COPD in mid March 2010.   CURRENT MEDICATIONS AT THE TIME OF ADMISSION:  Those which she was  discharged on  on January 15, 2009:  1. Trazodone 200 mg p.o. q.h.s.  2. Prozac 20 mg daily.  3. Hydrocodone 7.5 mg q.6h. p.r.n. chronic back pain.  4. Ambien 10 mg 1-2 tablets h.s. p.r.n. insomnia.  5. Catapres 0.05 mg q.i.d. started on the medical unit.  6. Combivent inhaler 2 puffs q.i.d.  7. Librium was scheduled as 10 mg t.i.d. through January 26, 2009 with      continuing taper down.   DRUG ALLERGIES:  No known drug allergies.   POSITIVE PHYSICAL FINDINGS:  Physical exam was done in the emergency  room as noted in the record.   LABORATORY DATA:  Salicylate level was less than 4.  Acetaminophen level  essentially negative.  Urine drug screen was positive for opiates,  cocaine, benzodiazepines and amphetamines.  CBC unremarkable.  Alcohol  level less than 5.  Chemistries within normal limits.   MENTAL STATUS EXAM:  Fully alert female, anxious affect.  Some increase  in motor activity.  Some quick impulsive movements.  Speech is rapid,  mildly pressured at times, but she is oriented x4 in full contact with   reality.  Insight is adequate.  Mood is anxious and restless.  Thought  process is logical.  No evidence of hallucinations.  No homicidal  thoughts.  Not expressing any suicidal thoughts today.  She is oriented  x4.  Memory is poor, especially for events in the past 4 days.  A  somewhat unreliable historian.  Not able to relate what she was actually  taking in terms of medications.  Mood is anxious and mildly elevated  today, somewhat expansive.   IMPRESSION:  AXIS I:  Mood disorder NOS.  Cocaine abuse, rule out  dependence.  Polysubstance abuse.  AXIS II:  Deferred.  AXIS III:  Chronic obstructive pulmonary disease.  Chronic low back  pain.  AXIS IV:  Deferred.  Having stable home and stable employment is an  asset.  AXIS V:  Current is 42; past year not known.   PLAN:  The plan is to voluntarily admit her.  We are going to repeat her  CMET after basically an unknown overdose of mixed group of medications  and check her liver enzymes.  We are going to discontinue the Prozac  that she is on.  Continue her on Ativan 1 mg q.6h. p.r.n. anxiety and  will continue her hydrocodone 7.5 mg q.6h. p.r.n. for pain and continue  her trazodone 200 mg p.o. q.h.s. and are not going to resume her  clonidine at this time.  Her blood pressure is normal.      Margaret A. Scott, N.P.      Geoffery Lyons, M.D.  Electronically Signed    MAS/MEDQ  D:  01/21/2009  T:  01/21/2009  Job:  [id number]

## 2011-02-28 NOTE — Discharge Summary (Signed)
[name], [name] NO.:  [id number]   MEDICAL RECORD NO.:  [id number]          PATIENT TYPE:  IPS   LOCATION:  0505                          FACILITY:  [facility]   PHYSICIAN:  Geoffery Lyons, M.D.      DATE OF BIRTH:  Feb 23, 1956   DATE OF ADMISSION:  01/06/2008  DATE OF DISCHARGE:  01/16/2008                               DISCHARGE SUMMARY   CHIEF COMPLAINT/HISTORY OF PRESENT ILLNESS:  This was one of several  admissions to [facility] for this 55 year old female  voluntarily admitted.  History of intentional overdose, overdosing on 20  tablets of aspirin, 16 tablets of Celexa with intention to kill herself.  She was home for 3 days after being hospitalized for an overdose earlier  at [facility].  Endorsed things did not get any better.  Took the  pills.  She was found by her church members, who had called and taken  her to the emergency room.  She is aware that she was positive for  cocaine.  Denies any alcohol use.  Continues to endorse depressive  symptoms.  Denies any current suicidal thoughts.  Already requesting  medications for pain.   PAST PSYCHIATRIC HISTORY:  Several admissions to [facility].   ALCOHOL AND DRUG HISTORY:  Persistent use of cocaine.   MEDICAL HISTORY:  Back pain.   MEDICATIONS:  Celexa, Valium, Remeron, Ambien and Vicodin.   PHYSICAL EXAMINATION:  Failed to show any acute findings.   LABORATORY WORKUP:  Hemoglobin 15.5.  UDS positive for cocaine and  benzodiazepine.   MENTAL STATUS EXAM:  Exam reveals a female who looks tired.  Casually  dressed.  Poor eye contact.  Speech is clear.  Somewhat irritable  __________  logical, coherent and relevant.  Very frustrated, upset with  her situation, but no active suicidal or homicidal ideas.  No delusions.  No hallucinations.  Cognition well-preserved.   ADMITTING DIAGNOSES:  AXIS I:  Major depressive disorder, rule out  substance-induced mood  disorder, cocaine dependence.  AXIS II:  No diagnosis.  AXIS III:  Salicylate overdose.  AXIS IV:  Moderate.  AXIS V:  On admission 35, highest GAF in the last year 60.   COURSE IN THE HOSPITAL:  She was admitted.  She was started in  individual and group psychotherapy.  She was maintained on the Celexa 20  mg per day.  She was on clonidine 0.1 four times a day, Voltaren 50 mg  per day, Colace 100 mg per day, Robaxin 750 four times a day, Remeron 30  mg at bedtime.  Initially on Librium 10 mg 3 times a day.  We worked  towards detoxing, weaning off the Librium, and we used the clonidine as  needed.  We also prescribed some Seroquel.  As already stated, mostly  she said that she left the unit and again the daughter was not willing  to work with her in order to see her granddaughter.  She felt one more  time that the daughter was keeping her out of her life.  What caused  increased depression was the sense that she was not going to be able to  handle things, feeling down, depressed, rejected, relapsed on cocaine  and the overdose.  As already stated, 2 back-to-back admissions to [facility].  This will be the third with relapses, mostly  cocaine after conflict with the daughter, who she says basically wants  her out of her life.  All this secondary to her daughter as she claims  being now with a female partner, and this female partner calling the  shots.  She was very tearful, endorsed feeling overwhelmed.  Wanted to  get her life back together.  Endorsed that she could not allow the  daughter to make her want to kill herself all the time.  Endorsed she  was ready to move on.  She endorsed persistent pain, having a hard time  functioning in the unit.  Cannot use the clonidine due to decreased  blood pressure.  Wanted Tylenol #3 short term as Tylenol by itself was  not doing anything.  Felt that her daughter was not really that  interested in her well-being.  She was  wanting to pursue residential  treatment but not sure if she was going to be able to afford it.  She  was having a lot of anxiety, depression.  Endorsed pain.  We used  Tylenol #3 short term.  She continued to deal with the loss of the  relationship with the daughter.  We worked on Pharmacologist, relapse  prevention.  She continued to have a very hard time.  Afraid that in  losing the daughter she is going to lose the granddaughter too.  She was  given a smaller dose of clonidine, which did work for her.  She chose  not to call her daughter and put some distance between them.  By March  31, it was clear that she was planning on going to residential treatment  program.  She wanted to stay a few more days.  There was an earache that  was treated, some mood changes, irritability, but we continued to work  on coping skills.  On April 1 she endorsed she was better.  Objectively  she was better.  She was more active in the milieu.  She was exhibiting  a broader range of affect.  She was able to interact well with the other  patients and staff, was able to accept the fact that the relationship  with the daughter was a trigger, and she was not going to continue to  repeat the same pattern over and over again.  On April 2, she was in  full contact with reality.  There were no active suicidal or homicidal  ideas, no hallucinations or delusions.  Her mood was euthymic.  She was  willing and motivated to pursue outpatient treatment.  Committed to  abstinence.   DISCHARGE DIAGNOSES:  AXIS I:  Mood disorder NOS.  Major depressive  disorder.  Cocaine abuse.  AXIS II:  No diagnosis.  AXIS III:  No diagnosis.  AXIS IV:  Moderate.  AXIS V:  On discharge 55.   Discharged on Celexa 20 mg per day, Voltaren 50 mg twice a day, Robaxin  350 mg 4 times a day, Remeron 30 mg at night, Librium 10 mg twice a day  with plans to wean off, Colace 100 mg per day, Catapres  0.5 tab 4 times  a day, MiraLax 17 grams  daily.   Followup by [facility]  [facility].      Geoffery Lyons, M.D.  Electronically Signed     IL/MEDQ  D:  02/12/2008  T:  02/12/2008  Job:  [id number]

## 2011-02-28 NOTE — Group Therapy Note (Signed)
[name], [name]               ACCOUNT NO.:  [id number]   MEDICAL RECORD NO.:  [id number]          PATIENT TYPE:  INP   LOCATION:  A322                          FACILITY:  [facility]   PHYSICIAN:  [name], DO    DATE OF BIRTH:  Mar 23, 1956   DATE OF PROCEDURE:  01/08/2009  DATE OF DISCHARGE:                                 PROGRESS NOTE   The patient seen today stating she is unsure of where she is.  She is  having some hallucinations and is mentioning that they are not auditory  but they are more visual.  She did not recognize who I was and after I  introduced myself, still could not figure it out.  As mentioned before,  the patient did not want to be transferred to the neuro psych unit.  I  talked to her about going up on her Celexa and will see if that helps  and may be trying at trial dose of Haldol to see if this will help her  too.  She is improving clinically; so, I am hoping she will be able to  be discharged in the next 1-2 days.   PHYSICAL EXAMINATION:  Her vitals are as follows:  Temperature 97.9,  pulse 115, respirations 16, blood pressure 126/85.  Generally, she is a thin Caucasian woman who is well-developed, well  nourished, and in no acute distress.  HEART:  Regular rate and rhythm.  LUNGS:  Clear to auscultation with expiratory wheezing noted and  prolonged breaths.  ABDOMEN:  Soft, nontender and nondistended.  EXTREMITIES:  Positive pulses.   Her white count is 13.5, hemoglobin 12.6, hematocrit 37.6, and platelet  count 221.  Sodium is 144, potassium 4.1, chloride 113, CO2 26, glucose  135, BUN 6, creatinine 0.71.   ASSESSMENT/PLAN:  1. Acute exacerbation of chronic obstructive pulmonary disease.  She      seems to be improving from yesterday.  She still has some      expiratory wheezing but I think another 24 hours may help her.  2. Hallucinations with severe depression and suicide attempt.  We will      go ahead and try a trial of Haldol and see if that  works for her      and we will up her Celexa.  3. Leukocytosis.  I think this is possibly due to the steroid use.  4. Hypertension.  Will continue to monitor this.  5. Tachycardia.  She still is tachycardiac, however, there are no EKG      changes at this point in time; so we will continue to monitor her.      [name], DO  Electronically Signed     CB/MEDQ  D:  01/08/2009  T:  01/08/2009  Job:  [id number]

## 2011-02-28 NOTE — H&P (Signed)
NAMESHAQUILLE, MURDY NO.:  [id number]   MEDICAL RECORD NO.:  [id number]          PATIENT TYPE:  IPS   LOCATION:  0504                          FACILITY:  [facility]   PHYSICIAN:  Geoffery Lyons, M.D.      DATE OF BIRTH:  09-13-1956   DATE OF ADMISSION:  12/12/2007  DATE OF DISCHARGE:                       PSYCHIATRIC ADMISSION ASSESSMENT   This is a 55 year old female who was voluntarily admitted on December 12, 2007.   HISTORY OF PRESENT ILLNESS:  The patient is a transfer from [facility], where she was assessed for a potential overdose and self-  inflicted injury.  The patient did report that she was taking her  Valium, had taken 30 Valium tablets over a 3-day period along with 10  Celexa tablets.  Just wanted to sleep.  She states that she also cut  herself with a razor to both her wrists with one wrist requiring  suturing.  She also reports relapsing on cocaine in the last week but  states that, but that's not part of the problem.  She has been having  ongoing conflict with her adult daughter who 2 years ago had a woman  move in with her and had, she states, kicked her mother out.  The  patient depression, feeling very isolated.   PAST PSYCHIATRIC HISTORY:  The patient was here 1 year ago for similar  symptoms.  She sees Dr. Betti Cruz for outpatient mental health services.   SOCIAL HISTORY:  She is a 55 year old female.  She has four adult  children.  She lives in an apartment alone.  She works at the [employer].  She denies any legal problems.   FAMILY HISTORY:  None.   ALCOHOL AND DRUG HISTORY:  The patient does smoke cigarettes.  Denies  any alcohol use and recently relapsed on cocaine.   PRIMARY CARE Jarah Pember:  None.   MEDICAL PROBLEMS:  The patient denies any major medical problems.  Again, she does have a self-inflicted injury, a laceration to her right  wrist that required suturing.   MEDICATIONS:  Is on Valium 5 mg b.i.d., Celexa  20 mg daily.  Reports  compliance with those medications.   DRUG ALLERGIES:  No known allergies.   PHYSICAL EXAM:  This is a middle-aged female that was fully assessed at  [facility]  She is unkempt, thin, appears very tired.  She has  a dressing to her right wrist.  There is some bruising noted.  There is  no obvious bleeding.  She received fluids in the emergency room, Tylenol  for pain, and received tetanus toxoid.   LABS:  CBC is within normal limits.  Her urinalysis is negative.  Her  urine drug screen is positive for cocaine, positive for benzodiazepines.  Her EKG showed normal sinus rhythm.   MENTAL STATUS EXAM:  Again, this is an unkempt, thin female, sleepy,  poor eye contact.  Her speech is soft-spoken.  She is polite.  Mood  depressed.  The patient's affect is flat.  She appears very tired, looks  worn.  Thought processes  are coherent.  No evidence of any psychotic  symptoms.  Cognitive function intact.  Her memory is fair.  Judgment and  insight are poor.   AXIS I:  1.  Depressive disorder, not otherwise specified.  2.  Cocaine  abuse rule out dependence.  AXIS II:  Deferred.  AXIS III:  Laceration to the right wrist.  AXIS IV:  Problems with her adult daughter, possible other psychosocial  problems.  AXIS V:  Current is 35.   PLAN:  To contract for safety.  We will stabilize mood and thinking.  We  will monitor her wounds.  Sutures are to come out in 7 days, confirmed  by [facility] emergency department.  We will resume her Valium and  Celexa.  Seroquel may be added to help with mood lability.  The patient  is to follow up with Dr. Betti Cruz for outpatient services.  Planned length  of stay is a 4-6 days.      Landry Corporal, N.P.      Geoffery Lyons, M.D.  Electronically Signed    JO/MEDQ  D:  12/13/2007  T:  12/14/2007  Job:  [id number]

## 2011-02-28 NOTE — Discharge Summary (Signed)
[name], [name]               ACCOUNT NO.:  [id number]   MEDICAL RECORD NO.:  [id number]          PATIENT TYPE:  INP   LOCATION:  A308                          FACILITY:  [facility]   PHYSICIAN:  [name], DO    DATE OF BIRTH:  1956/06/18   DATE OF ADMISSION:  05/31/2008  DATE OF DISCHARGE:  08/19/2009LH                               DISCHARGE SUMMARY   ADMISSION DIAGNOSES:  1. Salicylate poisoning intentional.  2. Suicide attempt.  3. Left wrist self-sustained injury.  4. History of depression.  5. Intoxication with alcohol.  6. Chronic back pain.   DISCHARGE DIAGNOSES:  1. Salicylate poisoning intentional, resolved.  2. Suicide attempt.  3. Left wrist self-sustained injury.  4. History of depression.  5. Intoxication with alcohol.  6. Chronic back pain.   CONSULTS:  ACT team was consulted.   TESTS THAT WERE DONE:  She had none.   Her H&P was done by Dr. Rito [name].  Please refer.  To summarize, the  patient is 55 year old Caucasian female who he was admitted to the  service of [facility] for intentionally ingesting some Valium and  salicylate.  She took an unspecified amount of aspirin and also  attempted to slash her wrist.  She was then brought in to be evaluated.   HOSPITAL COURSE:  1. She was monitored, her salicylate levels were monitored q. 24, as      well as ABGs.  They placed her on a bicarbonate drip.  They also      checked acetaminophen level as well.  There was some concern as to      whether or not her salicylate level was over 100, and so they were      considering dialyzing her, but it was not.  2. Suicide attempt with slashing of the wrist.  The patient was      treated while on the ED with sutures and that is been taken care of      with wound care management.  3. History of chronic back pain.  She was only given Tylenol.  No      narcotic medications due to her history.  4. DVT prophylaxis and GI prophylaxis was continued while she was  here.  The patient continued her progress of being coming more and      more alert.  She complained about pain, but still continued to do      well.  Her bicarb drip was discontinued on day 3 of admission and      she was transferred out of the ICU to the general medicine floor.      The ACT team came to see her and it was determined that due to her      suicide attempt that she probably needed to be transferred to      behavioral health.  Also the patient had been exhibiting drug-      seeking behavior requesting more and more Phenergan, but she has      not received it.  The plan today, the patient was seen today  resting comfortably in bed.  She did not have any complaints, was      wondering what the next step would be for her.  I spoke to the ACT      team member who went to see her.   PHYSICAL EXAMINATION:  VITAL SIGNS:  Her blood pressure is 97/73,  temperature 97.5, pulse 76.  Respirations 22.  CONSTITUTIONAL:  The patient is a 55 year old Caucasian female who is  well-developed, well-nourished distress.  HEART:  Regular rate and rhythm.  LUNGS:  Clear to auscultation bilaterally.  ABDOMEN:  Soft, nontender, nondistended.  EXTREMITIES:  Left wrist positively bandaged.  No drainage or a seventh  of infection noted.   LABS:  For today are as follows.  White count 6.1, hemoglobin 0.6,  hematocrit of 34.8 and platelet count of 198,000.  Sodium was 143,  potassium 4.3, chloride 108, pO2 33, glucose started to respond to 0.73.   MEDICATIONS WE RECOMMEND HER TO CONTINUE TAKING:  1. Librium 10 mg twice a day.  2. Celexa 40 mg twice a day.   We definitely do not recommend she takes Vicodin 7.5/750 and her valium  10 mg 4 times a day as needed, and clonidine 0.5 mg 4 times a day.   The patient's condition is stable.  Her disposition will be to  [facility] where she will be evaluated by the behavioral health  team.      [name], DO  Electronically  Signed     CB/MEDQ  D:  06/03/2008  T:  06/03/2008  Job:  [id number]

## 2011-02-28 NOTE — Procedures (Signed)
NAMEGIFT, RUECKERT               ACCOUNT NO.:  [id number]   MEDICAL RECORD NO.:  [id number]          PATIENT TYPE:  OUT   LOCATION:  RAD                           FACILITY:  [facility]   PHYSICIAN:  Gerrit Friends. Dietrich Pates, MD, FACCDATE OF BIRTH:  13-Mar-1956   DATE OF PROCEDURE:  05/07/2008  DATE OF DISCHARGE:  05/07/2008                                ECHOCARDIOGRAM   REFERRING:  Franchot Heidelberg, MD   CLINICAL DATA:  A 55 year old woman with chest pain.  1. Treadmill exercise performed to a workload of 13 METS and a heart      rate of 184, 110% of age - predicted maximum.  Exercise      discontinued due to mild dyspnea and dizziness; no chest pain      reported.  2. Blood pressure increased from a resting value of 110/60 to 140/90      during exercise and 150/90 early in recovery, a normal response.      No arrhythmias noted.   EKG:  Normal sinus rhythm; delayed R-wave progression.   STRESS EKG:  No significant change.   BASELINE ECHOCARDIOGRAPHIC IMAGES:  Normal left atrium, right atrium,  and right ventricle; normal aortic, mitral, and tricuspid valves; normal  internal dimension, wall thickness, regional and global function of the  left ventricle.   POST-STRESS ECHOCARDIOGRAM:  Hyperdynamic function in all segments.   IMPRESSION:  Negative stress echocardiogram revealing good exercise  tolerance and neither electrocardiographic nor echocardiographic  evidence for ischemia or infarction.  Other findings as noted.      Gerrit Friends. Dietrich Pates, MD, [facility]  Electronically Signed     RMR/MEDQ  D:  05/09/2008  T:  05/09/2008  Job:  [id number]
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
## 2011-02-28 NOTE — Discharge Summary (Signed)
[name], [name] NO.:  [id number]   MEDICAL RECORD NO.:  [id number]          PATIENT TYPE:  IPS   LOCATION:  0403                          [facility]ILITY[facility]  [facility]   PHYSICIAN:  Anselm Jungling, MD  DATE OF BIRTH:  11/13/1955   DATE OF ADMISSION:  06/03/2008  DATE OF DISCHARGE:  06/09/2008                               DISCHARGE SUMMARY   IDENTIFYING DATA/REASON FOR ADMISSION:  The patient is a 55 year old  single white female who has had multiple prior [facility] admissions for  problems related to mood disorder, but mostly substance abuse.  On this  occasion, the patient stated I cut my wrists, because I wanted to.  Because I could not go to my granddaughter's birthday party.  The  patient had presented at the emergency department and required sutures  on her left wrist for this self-inflicted injury.  Please refer to the  admission note for further details pertaining to the symptoms,  circumstances and history that led to her hospitalization.  She was  given initial Axis I diagnosis of polysubstance abuse, polysubstance  dependence and Axis II disorder NOS.   MEDICAL/LABORATORY:  The patient was treated for her wound in the  emergency department.  Once admitted to the inpatient psychiatric  service, was medically and physically assessed by the psychiatric nurse  practitioner.  She was given naproxen for pain.  She was given MiraLax  for bowel regularity.  There were no other significant medical issues.   HOSPITAL COURSE:  The patient was admitted to the adult inpatient  psychiatric service.  She presented as a petite, slender woman who was  initially alert, fully oriented, mildly tremulous, irritable and  anxious.  She was insisting that she would need narcotic pain relievers  during her stay, even when it was explained to her patiently that this  would be contraindicated due to her very clear history of substance  abuse.  She displayed no signs or symptoms of  psychosis or thought  disorder.  She denied any further suicidal ideation.   She was involved in the therapeutic milieu.  She was started on a  regimen of Librium in tapering fashion to bring about detoxification  from benzodiazepines and alcohol.  To address agitation, anxiety, and  her extreme restlessness, Seroquel in low doses was used on a t.i.d.  basis and adjusted during her stay.  Trazodone was helpful for sleep.  She was continued on Celexa 40 mg daily.   She participated in therapeutic groups and activities, and was a  reasonably good participant.  Most striking about her interaction with  the treatment situation was her daily and persistent requests for  additional medications of various kinds.  She again as in previous  admissions, had difficulty acknowledging the depth of her dependence on  chemicals, both prescribed, nonprescribed, illicit and legitimate.   The patient also has a history of significant family dysfunction.  She  expressed a lot of ambivalence about the possibility of returning to  live with various family members including her daughter.  At the time of  discharge, she did not  appear to be interested in any referral to any  substance abuse program per se.  She was interested in continuing her  psychotropic medications.  She agreed following aftercare plan.   AFTERCARE:  The patient was to follow up at [facility] in  [place] with an appointment on June 12, 2008.   DISCHARGE MEDICATIONS:  1. Naproxen 500 mg b.i.d.  2. MiraLax 17 gm daily.  3. Celexa 40 mg daily.  4. Trazodone 100 mg q.h.s.  5. Seroquel 50 mg t.i.d. and 100 mg q.h.s.   DISCHARGE DIAGNOSES:  AXIS I:  Polysubstance dependence, early  remission.  Substance-induced mood disorder.  AXIS II:  Cluster B  personality traits.  AXIS III:  Status post wrist slash.  AXIS IV:  Stressors severe.  AXIS V:  Global assessment of functioning on discharge 50.      Anselm Jungling,  MD  Electronically Signed     SPB/MEDQ  D:  06/09/2008  T:  06/09/2008  Job:  [id number]

## 2011-02-28 NOTE — H&P (Signed)
NAMEMACKINLEY, CASSADAY NO.:  [id number]   MEDICAL RECORD NO.:  [id number]          PATIENT TYPE:  IPS   LOCATION:  0406                          [facility]ACILITY[facility]  [facility]   PHYSICIAN:  Anselm Jungling, MD  DATE OF BIRTH:  [birth year]   DATE OF ADMISSION:  12/28/2007  DATE OF DISCHARGE:                       PSYCHIATRIC ADMISSION ASSESSMENT   This is an involuntary admission to the service of Dr. Geralyn Flash.   IDENTIFYING INFORMATION:  This is a 55 year old divorced white female.  Apparently, she attempted suicide by intentional overdose.  She took 20-  30 aspirin, called her prior nurse here at the [facility],  who had the police called and picked her up and took her to the  emergency room at [facility] for medical clearance.   The patient was recently discharged on March 6th.  She stated that she  had gotten really depressed the other day and used drugs again.  She  also filled her Xanax and Valium on Saturday.  They were gone by  Wednesday.   PAST PSYCHIATRIC HISTORY:  She was admitted here February 26 to March 6.  At that time she had potentially overdosed and she also had a cut on her  left wrist and had stitches needed.  She also did this approximately a  year ago.  She is seen on the outside by Dr. Betti Cruz, unfortunately she  did not make her post discharge followup appointment.   SOCIAL HISTORY:  She has a high school diploma.  She graduated in 1974.  She has been married and divorced once.  She has 3 living children.  Her  daughter is 47.  She has 2 sons, ages 42 and 29, and she had a baby die  at age 49 months approximately 21 years ago.  She is employed ata [employer].   ALCOHOL/DRUG HISTORY:  She states that she was using a straw to sniff  aspirin and apparently this straw in the past had been used for cocaine  but she denies relapsing since her discharge on March 6th.  She does  abuse her benzodiazepines.  She has been using  cocaine in the past, and  she also abuses opioids.  Her longest period of sobriety was for 5 years  and that was approximately a year ago.   FAMILY HISTORY:  She states that her mother took Xanax and other pills.   PRIMARY CARE Lesette Frary:  Dr. Erby Pian.   PSYCHIATRIST:  Daine Floras, M.D.   MEDICAL PROBLEMS:  1. Back pain.  2. Migraines.   MEDICATIONS:  When discharged on March 6th, she was discharged on:  1. Celexa 20 mg p.o. daily.  2. Remeron SolTab 30 mg at h.s.  3. Valium 5 mg b.i.d.  4. Restoril 30 mg at h.s.  5. She finished a 2-more-day course of Diflucan 200 mg p.o. daily.  6. Colace 100 mg p.o. daily.  7. MiraLax 17 G daily.  8. Vicodin 7.5 mg every four to six hours p.r.n. as needed for pain.   DRUG ALLERGIES:  She has no known drug allergies.  PHYSICAL FINDINGS:  She was medically cleared in the emergency  department.  Her UDS was positive for cocaine and benzos but had no  opioids.  She had no alcohol on board.  Initially her aspirin level was  elevated at 32.7.  She still had her stitches in.  We will be taking her  stitches out today.  She complains of back pain and headache and she had  no other positives on her review of systems and no other remarkable  physical findings.   She is also status post:  1. A hysterectomy in 1986.  2. Bladder surgery in 2007.  3. Hernia surgery years ago, she does not remember when.   PHYSICAL EXAMINATION:  VITAL SIGNS:  On admission show she is 61 inches  tall.  She weighs 113.  Temperature is 97.4.  Blood pressure was 113/74  to 121/83.  Pulse is 70-73.  Respirations are 20.  MENTAL STATUS:  Today, she is alert and oriented.  She is appropriately  groomed, dressed, and nourished.  Her speech is increased at times.  Her  mood is irritable and labile.  Thought processes are clear, rational,  and focused on getting pain meds restarted.  Judgment and insight are  poor.  Concentration and memory are fair.  Intelligence is  at least  average.  She denies being actively suicidal or homicidal.  She denies  auditory or visual hallucinations.   DIAGNOSES:   AXIS I:  Major depressive disorder, recurrent, severe with intentional  overdose on aspirin.  She did have cocaine in her urine drug screen.  She denies relapsing but she is known to abuse cocaine.   AXIS II:  Deferred.   AXIS III:  Migraines and back pain.   AXIS IV:  Severe.   AXIS V:  Twenty-five.   PLAN:  Admit for safety and stabilization.  The patient was seen in  conjunction with Dr. Dub Mikes.  She had been put on the low-dose Librium  protocol to help her detox and her Celexa and Remeron will be restarted.  Her pain will be addressed with Robaxin 750 mg q.i.d.   ESTIMATED LENGTH OF STAY:  Four to five days.      Mickie Leonarda Salon, P.A.-C.      Anselm Jungling, MD  Electronically Signed    MD/MEDQ  D:  12/29/2007  T:  12/29/2007  Job:  [id number]
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
## 2011-02-28 NOTE — Group Therapy Note (Signed)
[name], [name]               ACCOUNT NO.:  [id number]   MEDICAL RECORD NO.:  [id number]          PATIENT TYPE:  INP   LOCATION:  IC04                          FACILITY:  [facility]   PHYSICIAN:  Osvaldo Shipper, MD     DATE OF BIRTH:  03-Dec-1955   DATE OF PROCEDURE:  06/02/2008  DATE OF DISCHARGE:                                 PROGRESS NOTE   SUBJECTIVE:  The patient feels fine.  She is complaining of some pain in  the left breast area and in her back which is chronic.  Otherwise, she  also mentioned that she is been having some discomfort when she tries to  swallow medications and food, and she attributes this to the NG tube  that was placed in the emergency department for a brief period.   OBJECTIVE FINDINGS:  Ins and outs, she was positive by about 4-1/2  liters yesterday.  VITAL SIGNS:  She has remained afebrile.  Heart rate in the 80s.  Blood  pressure is improved with a mean arterial pressure above 60  consistently.  The blood pressure is 131/41.  Saturation 96% on room  air.  Respiratory rate is 22.  LUNGS:  Clear to auscultation bilaterally.  No wheezing, rales or  rhonchi noted.  No dullness to percussion.  CARDIOVASCULAR:  S1-S2 is normal regular.  No murmurs appreciated.  ABDOMEN:  Soft, nontender, nondistended.  Bowel sounds are present.  No  mass or organomegaly is appreciated.  EXTREMITIES:  Do not show any edema.  Left wrist is covered in dressing.  I did examine this yesterday.  There was no active bleeding that was  noted.  Dressing change needs to be done on a daily basis.   LABORATORY DATA:  Her ABG is normal this morning.  Her CBC shows mild  anemia, otherwise unremarkable.  Coags are normal today.  Potassium is  4.5.  Renal function is normal.  She has mildly abnormal LFTs.  Albumin  is 2.9.  Salicylate level less than 4.   ASSESSMENT/PLAN:  1. Salicylate poisoning, intentional and possible suicidal attempt.      The patient's salicylate level has come  down.  She did experience      hypotension which was also corrected with aggressive fluid      resuscitation.  Her bicarb drip was discontinued yesterday.  So,      she is medically stable at this time.  She will be transferred out      of the ICU to the floor today.  2. Suicidal attempt with injury to the left breast.  No damage to the      arteries was done.  It was a superficial injury.  She continues to      be on observation with one-to-one sitter.  She will be seen back      today for possible transfer to behavioral health tomorrow.  3. Hypokalemia, corrected.  4. Elevated INR is normal today.  I do not know why it was up to begin      with.  5. History of depression.  6 .  History of chronic back pain, stable.   She does have a drug seeking behavior.  She is requesting more and more  Phenergan, which we will not provide.  Phenergan to be given only as  needed.  I might even go down on the dose today.   She continues to be on GI prophylaxis.  She is on DVT prophylaxis with  sequential compressive devices.   So the plan on her will be to transfer her to floor today and hopefully  get her to behavioral health tomorrow if she continues to be stable.      Osvaldo Shipper, MD  Electronically Signed     GK/MEDQ  D:  06/02/2008  T:  06/02/2008  Job:  [id number]

## 2011-02-28 NOTE — H&P (Signed)
NAMEJESELLE, HISER NO.:  [id number]   MEDICAL RECORD NO.:  [id number]          PATIENT TYPE:  IPS   LOCATION:  0505                          [facility]ILITY[facility]   PHYSICIAN:  Geoffery Lyons, M.D.      DATE OF BIRTH:  September 17, 1956   DATE OF ADMISSION:  01/06/2008  DATE OF DISCHARGE:                       PSYCHIATRIC ADMISSION ASSESSMENT   IDENTIFYING INFORMATION:  A 55 year old female voluntarily admitted  January 05, 2008.   HISTORY OF PRESENT ILLNESS:  The patient presents with a history of  intentional overdose, overdosing on approximately 20 tablets of aspirin  and 16 tablets of Celexa with her intention to kill herself.  She states  that she was home for 3 days after recently being hospitalized for an  overdose earlier from [facility].  She states things are no  better and took the pills.  She states that she was found by her church  members.  Had called and taken her to the emergency room.  She states  although she is aware that she was positive for cocaine, she is not sure  why this is.  She denies any alcohol use.  She has endorsing depressive  symptoms.  Denies any current suicidal thoughts and is requesting  medications for pain.   PAST PSYCHIATRIC HISTORY:  The patient has had several admissions to our  facility, was here last week for an overdose.   SOCIAL HISTORY:  This is a 55 year old female who lives alone.  She is  on disability.  No legal problems.  Has an adult daughter.   FAMILY HISTORY:  None.   ALCOHOL/DRUG USE:  Denies any alcohol use.  Urine drug screen is  positive for cocaine.   PRIMARY CARE Ciarrah Rae:  The patient has been going to [facility] for her back pain and receiving medications.  She also  reports that her primary care Tywana Robotham was then prescribing her pain  medications.   MEDICAL PROBLEMS:  Back pain.   MEDICATION:  Has been listed and are Celexa, Valium, Remeron 30 at  bedtime, Ambien 10 and  Vicodin for pain.   DRUG ALLERGIES:  NO KNOWN ALLERGIES.   PHYSICAL EXAMINATION:  GENERAL:  The patient was fully assessed at [facility].  This is a thin, tired-appearing middle-aged female.  VITAL SIGNS:  Temperature is 97, 76 heart rate, 20 respirations, blood  pressure 133/88.   LABORATORY DATA:  Hemoglobin 15.5, platelet count at 453.  Acetaminophen  level less than 10, salicylate level was elevated at 34.9 down to 17.8.  Alcohol level less than 5.  Her urinalysis negative.  Urine drug screen  positive for cocaine, positive for benzodiazepines, prothrombin time was  14.2 and PTT of 29.   MENTAL STATUS EXAM:  She is a middle-aged, tired-appearing female,  casually dressed.  She has poor eye contact.  Speech is clear.  The  patient has some mild irritability.  Thought process is coherent.  No  evidence of psychosis.  Cognitive function intact.  Memory is good.  Judgment insight is poor.   DIAGNOSES:  AXIS I:  Major depressive disorder, rule out substance-  induced mood disorder, cocaine abuse rule out dependence.  AXIS II:  Deferred.  AXIS III:  Salicylate overdose, tobacco abuse.  AXIS IV:  Problems with primary support group, other psychosocial  problems.  AXIS V:  Current is 35.   PLANS:  Plan for safety.  Stabilize mood and thinking.  We will address  her substance use.  Work on relapse prevention.  The patient may benefit  from being in a rehab program.  Will reinforce medication compliance and  coping skills.  Her tentative length of stay is 5-7 days.      Landry Corporal, N.P.      Geoffery Lyons, M.D.  Electronically Signed    JO/MEDQ  D:  01/06/2008  T:  01/06/2008  Job:  [id number]

## 2011-02-28 NOTE — Discharge Summary (Signed)
[name], [name]               ACCOUNT NO.:  [id number]   MEDICAL RECORD NO.:  [id number]          PATIENT TYPE:  INP   LOCATION:  A223                          FACILITY:  [facility]   PHYSICIAN:  [name], DO    DATE OF BIRTH:  21-Sep-1956   DATE OF ADMISSION:  01/04/2008  DATE OF DISCHARGE:  03/22/2009LH                               DISCHARGE SUMMARY   ADMISSION DIAGNOSIS:  Suicide attempt and salicylate overdose.   DISCHARGE DIAGNOSES:  1. Salsalate overdose/suicide attempt.  2. Anxiety and depression.  3. Tobacco abuse.   HOSPITAL COURSE:  The patient's H&P was done by Dr. [name], but  to summarize, the patient is a 55 year old woman who was admitted to the  service of [facility] for salicylate overdose.  She has had several at  times of suicide in the past, one which involved benzodiazepines as well  as slitting putting her wrist.  She was found to have taken an  undisclosed amount of aspirin due to some social situations that are  going on in her family.  She was brought to the ED and at this point in  time, her salicylate level was within the behavioral health limit for  admission, but once they repeated the salicylate level, it was elevated;  it had gone up from its previous level and it was determined that she  could not be admitted there until it was lower than this last reading;  therefore, she was admitted to the service of [facility].  She continued  to do well last night.  Her salicylate levels was checked again today  and it is 17.8, so this fits her criteria.  Also, her potassium is  slightly low, but we do have aggressive hydration for her.  We will  discontinue her hydration.  Her vitals are stable today and replace with  a potassium supplement.  We will have the ACT team come see her again  today and have the patient cleared medically for transfer to [facility].   DISCHARGE MEDICATIONS:  On the patient's medications that she is on, we  do  not have any doses on anything currently, but to the list of  medications, it include:  1. Celexa  2. Diazepam 5 mg twice a day.  3. Remeron, no dose.  4. Vicodin 7.5 mg, no frequency.  5. Ambien.   We will recommended when she goes to [facility] that they do  place her on her Celexa.  She does have extreme anxiety; they can place  her on the diazepam. As far as a Remeron, they can make that decision,  Vicodin, they can make that decision as well, and the Ambien, they can  determine what they want to do, once she is admitted.   DISPOSITION:  To Behavioral Health.   CONDITION ON DISCHARGE:  Her condition is stable.   FOLLOWUP:  She is to follow up with her primary care physician, who is  Dr. Saundra Shelling and hopefully she will get the necessary treatment that she  needs to help her deal with his current suicide attempt.  [name], DO  Electronically Signed     CB/MEDQ  D:  01/05/2008  T:  01/05/2008  Job:  [id number]   cc:   Dr. Saundra Shelling

## 2011-02-28 NOTE — Discharge Summary (Signed)
NAMEHADLYN, AMERO NO.:  [id number]   MEDICAL RECORD NO.:  [id number]          PATIENT TYPE:  IPS   LOCATION:  0304                          [facility]ACILITY:  [facility]   PHYSICIAN:  Jasmine Pang, M.D. DATE OF BIRTH:  05/25/1956   DATE OF ADMISSION:  01/12/2009  DATE OF DISCHARGE:  01/15/2009                               DISCHARGE SUMMARY   IDENTIFYING INFORMATION:  This is a 55 year old white female, who was  admitted on a voluntary basis on January 12, 2009.   HISTORY OF PRESENT ILLNESS:  The patient presents with the onset of  severe depression and hopelessness and suicidal thoughts for the past 48  hours since discharge from the hospital after treatment for acute  exacerbation of COPD.  For further admission information, see  psychiatric admission assessment.   PHYSICAL FINDINGS:  There were no acute physical or medical problems  noted.  Her physical exam was done in the ED.   DIAGNOSTIC STUDIES:  UDS was positive for cocaine, opioids, and  benzodiazepines.   HOSPITAL COURSE:  Upon admission, the patient was started on her home  medications of Celexa 20 mg daily; Librium 10 mg b.i.d.; Vicodin 7.5/750  mg q.6 h. p.r.n. pain; Zithromax 500 mg 1 dose, then discontinue;  Combivent inhaler 1 puff p.o. q.i.d.  She was also restarted on her home  medications of Catapres 0.05 mg p.o. q.i.d. and trazodone 200 mg p.o.  q.h.s.  On January 13, 2009, due to continued withdrawal symptoms, the  patient was placed on Librium 10 mg q.i.d. for 1 week, then t.i.d. for 1  week, then b.i.d. for 1 week, then daily for 1 week, and then  discontinue.  In individual sessions, she was friendly and cooperative.  There were no symptoms of psychosis or thought disorder.  The patient  stated she wanted help for her depression.  On January 14, 2009, the  patient stated my depression medicine is not working.  She stated she  had been up all night.  Mood was depressed and anxious.  She was  started  on Ambien 10 mg p.o. q.h.s. p.r.n. insomnia, may repeat x1.  Celexa was discontinued and she was started on Prozac 20 mg daily.  On  January 14, 2009, she was started on MiraLax 17 g b.i.d. and Colace 100 mg  p.o. b.i.d.  On January 15, 2009, mental status had improved markedly from  admission status. The patient's mood was less depressed, less anxious.  Affect was consistent with mood.  There was no suicidal or homicidal  ideation.  No thoughts of self-injurious behavior.  No auditory or  visual hallucinations.  No paranoia or delusions.  Thoughts were logical  and goal-directed.  Thought content, no predominant theme.  Cognitive  was grossly intact.  Insight good.  Judgment good.  Impulse control  good.  It was felt the patient was safe for discharge today.   DISCHARGE DIAGNOSES:  Axis I:  Mood disorder not otherwise specified,  cocaine abuse.  Axis II:  None.  Axis III:  Chronic obstructive pulmonary disease, hypertension,  glossitis.  Axis  IV: Moderate (problems with primary support group, other  psychosocial problems, burden of psychiatric problems, burden of medical  problems).  Axis V: Global assessment of functioning was 50 upon discharge.  Global  assessment of functioning was 46 upon admission.  Global assessment of  functioning highest past year was 60-65.   DISCHARGE PLANS:  There was no specific activity level or dietary  restrictions.   POSTHOSPITAL CARE PLANS:  The patient will be seen in [facility] in  [place] on January 19, 2009, at 8 o'clock a.m.  She will continue her  treatment with Dr. Betti Cruz.   DISCHARGE MEDICATIONS:  Librium 10 mg 4 times a day through January 19, 2009, then 3 times a day through January 26, 2009, then twice a day  through February 02, 2009, then once daily through February 09, 2009;  trazodone 200 mg bedtime; Prozac 20 mg daily; Ambien 10 mg 1-2 pills at  bedtime if needed for insomnia; Catapres 0.05 mg 4 times daily; Vicodin  as directed by her primary care  doctor; Combivent inhaler 4 times daily.      Jasmine Pang, M.D.  Electronically Signed     BHS/MEDQ  D:  01/18/2009  T:  01/19/2009  Job:  [id number]

## 2011-02-28 NOTE — Discharge Summary (Signed)
NAMELISANNE, PONCE NO.:  [id number]   MEDICAL RECORD NO.:  [id number]          PATIENT TYPE:  IPS   LOCATION:  0504                          [facility]ACILITY:  [facility]   PHYSICIAN:  Geoffery Lyons, M.D.      DATE OF BIRTH:  Sep 30, 1956   DATE OF ADMISSION:  12/12/2007  DATE OF DISCHARGE:  12/20/2007                               DISCHARGE SUMMARY   CHIEF COMPLAINT:  This was the second admission to [facility] for this 55 year old female voluntarily admitted.  She was  transferred from [facility] where she was assessed for a potential  overdose on self-inflicted injury.  She was taking her Valium taking 30  Valium tablets over a three day period along with 10 Celexa just wanting  to sleep.  She also cut herself with a razor to both her wrists with one  wrist requiring suturing.  Reported relapsing on cocaine in the last  week, but states that is not part of the problem.  She has been having  ongoing conflict with her adult daughter who two years ago she claimed  had a woman move in with her and she states kicked her mother out.  Endorsed depression, being isolated.   PAST PSYCHIATRIC HISTORY:  Was admitted a year ago to this unit.  Has  seen Dr. Betti Cruz on an outpatient basis.   SUBSTANCE ABUSE HISTORY:  Denies alcohol, but recently relapsed on  cocaine.   MEDICAL HISTORY:  Noncontributory.   MEDICATIONS:  1. Valium 5 mg twice a day.  2. Celexa 20 mg per day.   PHYSICAL EXAMINATION:  Physical exam failed to show any acute findings  except the lacerations already described as addressing to her right  wrist.   LABORATORY WORKUP:  CBC within normal limits.  UA negative.  UDS  positive for cocaine, benzodiazepines.   MENTAL STATUS EXAM:  Reveals alert cooperative female somewhat unkempt,  sleepy, poor eye contact.  Speech was soft-spoken, polite.  Mood  depressed, anxious, affect constricted.  Appears tired.  Thought  processes are clear, logical,  coherent, and relevant.  No evidence of  delusions and no active suicidal ideas and no hallucinations.  Cognition  well-preserved.   IMPRESSION:  AXIS I:  Depressive disorder not otherwise specified.  Cocaine abuse.  AXIS II:  No diagnosis.  AXIS III:  Laceration to the right wrist.  AXIS IV:  Moderate.  AXIS V:  Upon admission 35, GAF in the last year 60.   COURSE IN THE HOSPITAL:  She was admitted, started in individual and  group psychotherapy.  As already stated 55 year old female admitted  status post suicide gesture - attempt.  Said she got very upset after  interacting with daughter's girlfriend.  Says that the daughter got  involved in a gay relationship and once she did this they kicked her  out of the house.  Says her lover is ruling her  life.  She cut her  wrist after the argument.  Endorsed depressed mood, complaints of severe  insomnia.  Meds she has taken for sleep have not been  effective other  than the Restoril.   PAST PSYCHIATRIC HISTORY:  Several admissions to [facility], several diagnostic impressions.  Has reacted negatively to  several medications.  Claims confusion and psychosis to some of them as  recorded in the chart.  She did endorse feeling overwhelmed, wanting to  feel better.  She is also concerned about her daughter not allowing her  to see her grandkids that she mostly raised.  We went ahead and  increased the Celexa and the Remeron.  There was some suicidal ideation,  but able to contract for safety.  March 2 she had developed some urinary  retention, but by then she was reporting none.  She was back on the  Celexa.  Still sleep was an issue.  She was not isolated, but she was  anxious.  Sleep continued to be an issue, somatically focused.  Endorsed  anxiety.  Wanting to change the Valium.  Restoril she claimed was the  only medication that helped her.  March 4 continued to endorse anxiety.  Trigger for her suicide ideation was  mainly the relationship with her  daughter.  She continued to be symptom focused.  We tried to work on  Pharmacologist and empower her not to have to take more medication to  deal with the anxiety.  She was claiming that she got to a point where  she understood she had to let go of the daughter and she was ready to do  that for her sake and the sake of her granddaughter.  March 6 she was in  full contact with reality.  There were no active suicidal or homicidal  ideas, no hallucinations or delusions.  Felt ready to be discharged.  Was going to continue outpatient.  Will abstain from interacting with  the daughter, and she knew that the interaction was going to trigger  these episodes again and she did not want to get to that point.  As she  was stable enough and willing to pursue outpatient treatment we  discharged to outpatient follow-up, etc.   IMPRESSION:  AXIS I:  Mood disorder NOS.  Cocaine abuse.  AXIS II:  No diagnosis.  AXIS III:  Laceration to the right wrist.  AXIS IV:  Moderate.  AXIS V:  Upon discharge 50, 55.   DISCHARGE MEDICATIONS:  1. Celexa 20 mg per day.  2. Remeron four tabs 30 mg at night.  3. Valium 5 mg twice a day as needed.  4. Restoril 30 mg at night.  5. Colace 100 mg per day.  6. MiraLax 17 g daily.  7. Vicodin 7.5 one every 4-6 hours as needed for pain for five days.   FOLLOWUP:  To follow up with primary physician and with [facility].      Geoffery Lyons, M.D.  Electronically Signed     IL/MEDQ  D:  02/06/2008  T:  02/06/2008  Job:  [id number]

## 2011-02-28 NOTE — Letter (Signed)
April 30, 2008    Franchot Heidelberg, M.D.  [address], [address]  [place], [place] [address]   RE:  [name], [name]  MRN:  [id number]  /  DOB:  11-26-1955   Dear [name]:   It is my pleasure evaluating [name] in the office today in  consultation at your request for chest discomfort.  This nice woman has  no known cardiovascular disease.  She was admitted to [facility] approximately 2 years with chest discomfort and was seen by Dr.  Festus Aloe.  A stress nuclear study at that time was negative.  She  subsequently did well until a month or two ago when she developed  recurrent chest discomfort that is similar to those initial symptoms.  She is experiencing episodic circumoral numbness, numbness in the left  arm, and mild-to-moderate poorly characterized substernal chest  discomfort radiating to the left arm and to the left scapular region.  There is no relationship to exertion.  There is some associated dyspnea,  but no nausea nor diaphoresis.  There is no association with body  position or movement of the trunk or the left arm.  These episodes have  been coming more frequently such that she is symptomatic much of the  day.   [name] has multiple additional problems as you know.  She has been  plagued by anxiety and depression.  She reports frequent episodes of  nausea and emesis, perhaps occurring once or twice on most days.  These  were not related to meals nor other activities.  There is no special  time of day when events occur.  She has not found anything to make this  better or worse.  She was evaluated by [facility] in 2003  for abdominal discomfort.  Colonoscopy was negative at that time as far  as I know.   She takes no medications routinely; although, she uses Vicodin on most  days, perhaps once or twice.   PAST MEDICAL HISTORY:  Otherwise, notable for chronic low back pain with  documented history and physical in the past.   PAST  SURGICAL HISTORY:  Prior surgeries have included hysterectomy and  bladder resuspension.  She required a femoral hernia repair in 2006 for  incarceration.   SOCIAL HISTORY:  Currently disabled; previously employed as Location manager.  Nonetheless, she is able to do some walking.  She is  divorced with 4 children.   FAMILY HISTORY:  Positive for carcinoma of the lung in her mother, which  was fatal at the age of 5 and neoplastic disease in her sister as well.   REVIEW OF SYSTEMS:  Notable for migraine headaches, the need for  corrective lenses for near vision, intermittent palpitations, recent  weight loss of less than 10 pounds, and urinary frequency.  All other  systems reviewed and are negative.   PHYSICAL EXAMINATION:  GENERAL:  A pleasant trim woman, in no acute  distress.  VITAL SIGNS:  The weight is 113 pounds, blood pressure 110/80, heart  rate 90 and regular, and respirations 14.  HEENT:  EOMs full; normal lids and conjunctivae; normal oral mucosa.  NECK:  No jugular venous distention; normal carotid upstrokes without  bruits.  ENDOCRINE:  No thyromegaly.  SKIN:  No significant lesions.  LUNGS:  Slightly prolonged expiratory phase.  CARDIAC:  Fourth heart sound present; normal first and second heart  sounds; normal PMI.  ABDOMEN:  Soft and nontender; no masses; no organomegaly.  EXTREMITIES:  No edema; normal distal pulses.  NEUROLOGIC:  No focal weakness; normal muscle tone; normal cranial  nerves.   EKG:  Normal sinus rhythm; within normal limits.  No prior tracing for  comparison.   IMPRESSION:  Ms. [name] has very atypical chest discomfort with a  relatively low risk for coronary disease and a negative stress nuclear  study approximately 2 years ago.  Because she reports exercise  intolerance and chest discomfort, I believe a stress echo would be  worthwhile to quantify exercise capacity, verify normal oxygenation  during exertion and to rule out  coronary disease, which is unlikely.  We  will proceed with a stress echocardiogram to save her the radiation of a  nuclear study.   As we discussed, her weight loss and persistent nausea and emesis will  require further evaluation.  I will leave that to your discretion and  plan to see this nice woman once her stress test has been completed.   Thank you so much for sending her to see me.    Sincerely,      Gerrit Friends. Dietrich Pates, MD, [facility]  Electronically Signed    RMR/MedQ  DD: 04/30/2008  DT: 05/01/2008  Job #: [id number]

## 2011-03-01 ENCOUNTER — Other Ambulatory Visit (HOSPITAL_COMMUNITY): Payer: Self-pay | Admitting: Family Medicine

## 2011-03-01 DIAGNOSIS — Z139 Encounter for screening, unspecified: Secondary | ICD-10-CM

## 2011-03-03 NOTE — Discharge Summary (Signed)
NAMEKAYE, LUOMA               ACCOUNT NO.:  [id number]   MEDICAL RECORD NO.:  [id number]          PATIENT TYPE:  INP   LOCATION:  A428                          FACILITY:  [facility]   PHYSICIAN:  Langley Gauss, MD     DATE OF BIRTH:  07-19-1956   DATE OF ADMISSION:  10/05/2005  DATE OF DISCHARGE:  12/23/2006LH                                 DISCHARGE SUMMARY   PROCEDURES:  Anterior repair of an anterior colporrhaphy as well as  posterior repair of rectocele.  Of note, nonabsorbable mesh was placed  overlying the fascia both anterior and posteriorly.   DISPOSITION:  The patient should have some Metrogel solution left over from  the surgery which she can use intravaginally p.r.n. for any discharge which  occurs.  The vaginal patching was removed on postoperative day #1.  She is  getting a copy of standarized discharge instructions.  Advised to watch for  any leakage of urine other than up through the urethra.  She is sexually  abstinent.  She can expect some very light bleeding or brown discharge, but  should not experience any heavy vaginal bleeding.   DISCHARGE MEDICATIONS:  1.  Tylox.  2.  She does, at present, have a Climara patch 0.1 mg per day in place which      was placed in the recovery room.   LABORATORY DATA:  Admission hemoglobin and hematocrit 13.6/39/2, white count  6.2.  EKG within normal limits.  Electrolytes within normal limits.  O  positive blood type, negative antibody screen.  Intraoperative blood loss  250 cc with no significant bleeding after removal of the vaginal packing.  CBC is requested on date of discharge and is currently pending.   HOSPITAL COURSE:  See previous dictation.  Vaginal packing and Foley  catheter placed October 05, 2005, following the procedure.  She was  initially treated with a PCA Dilaudid pump which she did very well.  This  was continued until late in the afternoon on postoperative day #1 at which  time vaginal packing as well as  Foley catheter was removed.  PCA pump was  discontinued and she was started on p.o. Tylox.  She did have small amounts  of vaginal bleeding occurring following removal of the pack, but this  resolved promptly.  Subsequently, the patient has been fully ambulatory, has  been doing well with p.o. Tylox for pain relief.  Vital signs have remained  stable.  She has voided well with good bladder functioning.  She is thus  discharged home on today's date October 07, 2005.  Follow up in the office  in one weeks time.      Langley Gauss, MD  Electronically Signed     DC/MEDQ  D:  10/07/2005  T:  10/07/2005  Job:  [id number]

## 2011-03-03 NOTE — H&P (Signed)
NAMEERRICA, [name]               ACCOUNT NO.:  [id number]   MEDICAL RECORD NO.:  [id number]          PATIENT TYPE:  INP   LOCATION:  NA                            FACILITY:  [facility]   PHYSICIAN:  Randye Lobo, M.D.   DATE OF BIRTH:  [birth year]   DATE OF ADMISSION:  DATE OF DISCHARGE:                                HISTORY & PHYSICAL   CHIEF COMPLAINT:  Urinary urgency and dyspareunia.   HISTORY OF PRESENT ILLNESS:  The patient is a 55 year old gravida 4, para 48,  0-0-3, Caucasian female, status post total abdominal hysterectomy with  bilateral salpingo-oophorectomy in 1981 and status post anterior and  posterior colporrhaphy in 2000.  She presents with symptoms of urinary  urgency and dyspareunia.  The patient was referred by Dr. Javier Glazier. Rose after  evaluation at the [facility] where she was diagnosed with a  third degree cystocele.   The patient reports with urinary frequency and nocturia.  She has occasional  leakage of urine with coughing or laughing.  However, this is inconsistent.  She does not require the use of any pads.   The patient did undergo preoperative urodynamic testing which documented the  absence of genuine stress incontinence.  The uroflowmetry study was  intermittent.  The cystometrogram documented no evidence of instability.   The patient wishes for surgical treatment of the cystocele.   OBSTETRICAL/GYNECOLOGICAL HISTORY:  The patient is status post spontaneous  vaginal delivery x 3.  She is status post total abdominal hysterectomy with  bilateral salpingo-oophorectomy in 1981 for an ovarian cyst.  She is status  post anterior and posterior colporrhaphy five years ago.  The patient does  not use any hormone replacement therapy.  Her last Pap smear was two years  ago and was normal.  She has recently completed her mammogram.   PAST MEDICAL HISTORY:  The patient has smoked one-half package to one  package of cigarettes per day for the last 30  years.  She has quit the use  of tobacco in preparation for surgery.   PAST SURGICAL HISTORY:  1.  Total abdominal hysterectomy with bilateral salpingo-oophorectomy in      1981.  2.  Status post anterior and posterior colporrhaphy five years ago.  3.  Status post femoral herniorrhaphy in 1995.   MEDICATIONS:  1.  Premarin vaginal cream 2 gm per vagina at h.s. two times per week.  2.  Tylenol PM p.r.n.   ALLERGIES:  No known drug allergies.   SOCIAL HISTORY:  The patient is not currently married.  She took an early  retirement from the [place].  She has just recently quit the use  of tobacco.  She denies the use of alcohol or drugs.   FAMILY HISTORY:  Positive for gastric cancer in the patient's sister,  positive for lung cancer in the patient's mother, and positive for prostate  cancer in the patient's uncle.  Positive for coronary artery disease in the  patient's grandmother along with history of stroke.  Positive for  hypertension in the patient's mother.  Positive  for diabetes mellitus in the  patient's grandfather.   PHYSICAL EXAMINATION:  HEENT:  Normocephalic and atraumatic.  LUNGS:  Clear to auscultation bilaterally.  HEART:  S1 and S2 with a regular rate and rhythm.  No evidence of a murmur,  rub, or gallop.  ABDOMEN:  Soft and nontender and without evidence of hepatosplenomegaly or  organomegaly.  BREASTS:  No evidence of any dominant masses, skin retractions, nipple  discharge, or axillary adenopathy.  PELVIC:  Normal external genitalia and urethra.  There is evidence of a  second degree cystocele.  Bimanual exam documents the absence of the cervix  and uterus.  The vagina is noted to be shortened in length and has a two  fingerbreadth maximum diameter.  The vaginal apex and the posterior vaginal  wall are well supported.  No adnexal masses are appreciated.  Rectovaginal  examination confirms the above.   IMPRESSION:  The patient is a 55 year old para 3  female with recurrent  pelvic organ prolapse and urinary urgency.  Genuine stress incontinence  could not be diagnosed after formal urodynamic testing.   PLAN:  The patient will undergo an anterior colporrhaphy with a pelvisoft  porcine dermis graft at the [facility] [place] on September 15, 2004.  This will be accompanied by cystoscopy.  Risks, benefits, and  alternatives had been discussed with the patient who wishes to proceed.     Broo   BES/MEDQ  D:  09/14/2004  T:  09/14/2004  Job:  [id number]

## 2011-03-03 NOTE — Discharge Summary (Signed)
[facility]  Patient:    [name], CLOSSER Visit Number: [id number] MRN: [id number]          Service Type: GYN Location: 4W [id number] 01 Attending Physician:  Lendon Colonel Dictated by:   Kathie Rhodes. Kyra Manges, M.D. Admit Date:  01/29/2002 Discharge Date: 01/31/2002                             Discharge Summary  ADMISSION DIAGNOSIS:  Stress urinary incontinence and rectocele with symptomatic pelvic relaxation.  OPERATION:  Burch procedure with posterior repair.  BRIEF HISTORY:  [name] is a 55 year old female, who was a failed anterior repair, complains of pelvic pressure and feeling that things are falling out. She had a fairly large rectocele.  She is a gravida 4, para 3.  She had urethral mobility.  She decided to have surgery and, after detailed informed consent, was admitted.  Hemoglobin was 15.5.  Chest x-ray showed no evidence of disease.  HOSPITAL COURSE:  The patient was admitted to the hospital and underwent an exam under anesthesia, Burch procedure, posterior repair, and perineoplasty. Her postoperative course was uncomplicated.  She voided spontaneously and was discharged on April 18 to home and office care.  She was asked to call for fever, bleeding, or any other difficulty she could encounter.  CONDITION ON DISCHARGE:  Improved. Dictated by:   S. Kyra Manges, M.D. Attending Physician:  Lendon Colonel DD:  02/06/02 TD:  02/06/02 Job: [id number]

## 2011-03-03 NOTE — H&P (Signed)
NAMESAMARY, SHATZ               ACCOUNT NO.:  [id number]   MEDICAL RECORD NO.:  [id number]          PATIENT TYPE:  AMB   LOCATION:                                FACILITY:  [facility]   PHYSICIAN:  Lazaro Arms, M.D.   DATE OF BIRTH:  [birth year]   DATE OF ADMISSION:  DATE OF DISCHARGE:  LH                                HISTORY & PHYSICAL   [name] is a 55 year old white female who presented to me originally on  October 25, 2004 with complaint of ongoing pain in her vagina just below the  bladder.  She states that it has sort of been there to some degree ever  since her cystocele repair and sling which was done approximately four to  five years ago.  I really did not have any, on examination, notion about the  cause of the pain.  She describes it as a spasm type pain and a pressure  pain.  She came back in on April 26.  She missed the subsequent appointment  in May.  She was having a lot of urge incontinence symptoms and at that time  we planned to do urodynamics.  Urodynamics were very interesting in that she  had a very high urethral closure pressure and was holding an incredibly  large amount of urine.  She had a stable bladder and no stress incontinence  and she had a very large postvoid residual both when she initially used the  bathroom and at the end of the procedures of approximately 325 mL.  I saw  her back several weeks later and again she had a postvoid residual of  greater than 300 mL.  On examination you can indeed feel where the sling  and/or graft is going across her bladder neck.  As a result, I am not 100%  sure if that is what is causing all of her discomfort but I do know that is  causing all of her urge symptoms and probably the discomfort is from high  postvoid residuals and large bladder volumes at all times.  As a result, she  is admitted for cystocele repair and urethrolysis and revision.   PAST MEDICAL HISTORY:  Negative.   PAST SURGICAL HISTORY:  She had a  hysterectomy, of course, her bladder  repair, incarcerated hernia repair.  She is gravida 4, para 4.   ALLERGIES:  None.   MEDICATIONS:  None.   REVIEW OF SYSTEMS:  As per HPI.   PHYSICAL EXAMINATION:  HEENT:  Unremarkable.  Thyroid is normal.  LUNGS:  Clear.  HEART:  Regular rate and rhythm without regurgitation or gallop.  BREASTS:  Without mass, discharge, skin changes.  ABDOMEN:  Benign.  No  hepatosplenomegaly or masses.  She has normal external genitalia.  Vagina is  pink, moist.  No discharge.  The cuff is intact.  Cervix surgically absent.  Adnexa could not be palpated.   IMPRESSION:  1.  Bladder outlet obstruction status post cystocele and sling approximately      four to five years ago in [place].  2.  Vaginal pressure  and spasm which I think is secondary to bladder with      high postvoid residuals and high volumes.   PLAN:  Patient is admitted for anterior repair, urethrolysis, and revision  of her previous surgery.  She understands this may leave her with stress  incontinence but she certainly would be happy to have that based on what she  has been going through here the past several years.  She understands the  risks, benefits, indications, and will proceed.       LHE/MEDQ  D:  04/18/2005  T:  04/18/2005  Job:  [id number]

## 2011-03-03 NOTE — H&P (Signed)
NAMEMARIACELESTE, HERRERA               ACCOUNT NO.:  [id number]   MEDICAL RECORD NO.:  [id number]          PATIENT TYPE:  AMB   LOCATION:  DAY                           FACILITY:  [facility]   PHYSICIAN:  Lionel December, M.D.    DATE OF BIRTH:  06/18/1956   DATE OF ADMISSION:  DATE OF DISCHARGE:  LH                                HISTORY & PHYSICAL   CHIEF COMPLAINT:  Proctalgia, tenesmus and right lower quadrant abdominal  pain.   HISTORY OF PRESENT ILLNESS:  Ms. Beedle is a 55 year old Caucasian female  who is a new patient, self referral, for evaluation of at least a 3-month  history of worsening rectal pain.  She notes the pain is 4 out of 10 on pain  scale usually during the day.  At night she is having 10 out of 10 pain.  She reports she has been seen in the emergency room and treated for possible  fissures.  She also complains of tenesmus and rectal spasm.  She does note  history of constipation which has been chronic.  She can go weeks without a  bowel movement per her report.  She was frequently using laxatives although  has recently has discontinued this.  She notes hard stools when she does  have movements.  She denies any rectal bleeding or melena.  She also has  right lower quadrant abdominal pain which is intermittent 8 out of 10 on  pain scale.  She denies any fever or chills along with the pain.  She denies  any nausea or vomiting with the pain.  Her appetite has been good.  She does  report a 20 pound weight loss in the last month.  She denies any upper GI  symptoms, including heart burn, indigestion, dyspepsia, dysphagia or  odynophagia.   PAST MEDICAL HISTORY:  Colonoscopy many years ago.  She reportedly had some  polyps removed.  She does not have any further records.  This was performed  in [place].  She has diverticulosis.   PAST SURGICAL HISTORY:  Complete hysterectomy in 1980s.  Right incarcerated  femoral hernia status post repair about three years ago.  Bladder  tack.   CURRENT MEDICATIONS:  Denies any.   ALLERGIES:  No known drug allergies.   FAMILY HISTORY:  No known first degree relatives with colon or rectal  carcinoma.  However, she does report a maternal uncle with colon cancer  diagnosed in his 18s.  Mother is deceased age 6 secondary to lymphoma.  One  sister deceased with metastatic carcinoma of unknown etiology involving the  brain, lung and adrenal glands at age 25.  She has one healthy brother.   SOCIAL HISTORY:  Ms. Hathaway is currently single.  She lives with her  daughter and grandchildren.  She also has two sons in [place].  She is  currently unemployed but had been working at [employer] for the last  several years.  She reports about a half a pack of tobacco use, smoking  cigarettes for the last 33 years.  She denies any alcohol use.  She  denies  any current drug use.  Reports remote marijuana use once.   REVIEW OF SYSTEMS:  CONSTITUTIONAL:  See HPI.  She denies any fatigue, fever  or chills.  CARDIOVASCULAR:  Denies any chest pain or palpitations.  PULMONARY:  She has had a productive cough with some yellowish-green sputum  over the last two weeks.  Denies any shortness of breath or dyspnea.  GI:  See HPI.   PHYSICAL EXAMINATION:  VITAL SIGNS:  Weight 109.5 pounds, temperature 97.6,  blood pressure 110/76, pulse 80.  GENERAL:  Ms. Renninger is a 55 year old thin, Caucasian female, who is alert  and oriented, pleasant and cooperative in no acute distress.  HEENT:  Sclerae clear, nonicteric.  Conjunctiva pink.  Oropharynx pink and  moist without any lesion.  NECK:  Supple.  She does have about a 2 cm left anterior cervical chain  node, which was somewhat firm but freely mobile and nonfixed and slightly  tender to palpation.  Otherwise normal.  CHEST:  Heart regular rate and rhythm with normal S1, S2, without murmurs,  rubs, clicks or gallops.  LUNGS:  Clear to auscultation bilaterally.  ABDOMEN:  Flat with positive bowel  sounds times four.  No bruits  auscultated, soft, nontender, with multiple striae, nondistended, no  palpable mass or hepatosplenomegaly.  No rebound, tenderness or guarding.  RECTAL:  No external lesions visualized.  Good sphincter tone.  A small  amount of semiformed, light brown stool was obtained from the vault which  was Hemoccult negative.  EXTREMITIES:  2+ pedal pulses bilaterally. No edema.  Skin pink, warm and  dry without rash or jaundice.   ASSESSMENT:  Ms. Conaty is a 55 year old Caucasian female with a greater  than 63-month history of severe proctalgia, tenesmus and right lower quadrant  abdominal pain.  Not noted above, she did have CT scan on June 30, 2004.  She had extensive diverticula of the sigmoid portion of the colon  without evidence of diverticulitis.  She also reports a 20 pound weight loss  in the last month which is very concerning.  She does not describe decreased  caloric intact.  Given findings on rectal exam, there was nothing noted to  explain her severe tenesmus and proctalgia.  She could have fissure in ano.  She does have underlying history of chronic constipation as well.  Given her  age, I feel colorectal carcinoma should be ruled out.  As far as her right  lower quadrant pain is concerned, she could be having small bouts of  diverticulitis although she does not appear to actively have a flare at this  time.   RECOMMENDATIONS:  1.  Will schedule colonoscopy with Dr. Dionicia Abler in the near future.  I have      discussed the procedure including the risks and benefits which include      but are not limited to bleeding, infection, perforation, drug reactions.      She agrees with this plan.  Consent will be obtained.  2.  She is to go immediately to the emergency room if she develops severe      pain any worsening signs or symptoms.  3.  She is requesting pain medicine and therefore Darvocet-N 100 to be taken     every four to six hours p.r.n. severe  pain,  #20 with no refills given.  4.  Labs today to include CBC and CMP.      KC/MEDQ  D:  11/02/2004  T:  11/02/2004  Job:  [id number]

## 2011-03-03 NOTE — Discharge Summary (Signed)
[name], [name] NO.:  [id number]   MEDICAL RECORD NO.:  [id number]          PATIENT TYPE:  IPS   LOCATION:  0306                          FACILITY:  [facility]   PHYSICIAN:  Geoffery Lyons, M.D.      DATE OF BIRTH:  09-17-1956   DATE OF ADMISSION:  01/20/2009  DATE OF DISCHARGE:  02/09/2009                               DISCHARGE SUMMARY   CHIEF COMPLAINT AND PRESENT ILLNESS:  This was one of multiple  admissions to [facility] for this 55 year old female  voluntarily admitted.  She was discharged on January 15, 2009, after a 4-  day stay.  She was initially admitted with depression and hopelessness  and thoughts of suicide.  She returned to the emergency room January 20, 2009, drowsy, staggering, unstable gait, altered of consciousness.  Endorsed she had taken some extra pills, took whatever she was given to  complete the Librium taper, and she has not been able to sleep, was very  anxious, relapsed on cocaine once, feeling that her legs were giving  away.  She had fallen 3 times at home and has been quite depressed.   PAST PSYCHIATRIC HISTORY:  Followed by Dr. Betti Cruz at [facility] in  [place].  Eight admissions since 2005 to [facility].   ALCOHOL AND DRUG HISTORY:  Has history of alcohol abuse, cocaine abuse.  Longest abstinence, 6 months prior to March 2010.   MEDICAL HISTORY:  Status post exacerbation of her COPD and arthritis.   MEDICATION:  1. Trazodone 200 mg at night.  2. Prozac 20 mg per day.  3. Hydrocodone 7.5 mg every 6 hours as needed for chronic back pain.  4. Ambien 10 mg at bedtime.  5. Catapres 0.5 four times a day.  6. Combivent inhaler 2 puffs 4 times a day.   PHYSICAL EXAM:  Failed to show any acute findings.   LABORATORY WORKUP:  White blood cells 6.1, hemoglobin 14.3, sodium 138,  potassium 3.5, glucose 111, BUN 5, creatinine 0.95, SGOT 24, SGPT 103.   MENTAL STATUS EXAM:  Reveals a fully alert, cooperative female.   Mood,  anxiety.  Affect, anxiety.  Some quick impulsive movements.  Speech was  rapid, mildly pressured.  Affect anxious, obviously restless.  Thought  processes logical, coherent, and relevant.  No active delusions.  No  suicidal or homicidal ideas.  No hallucinations.  Cognition well  preserved.   ADMITTING DIAGNOSES:  AXIS I:  1. Mood disorder, not otherwise specified.  2. Cocaine abuse.  AXIS II:  No diagnosis.  AXIS III:  1. Chronic obstructive pulmonary disease.  2. Chronic low back pain.  AXIS IV:  Moderate.  AXIS V:  Upon admission, 35.  Highest Global Assessment of Functioning  in the last year 60.   COURSE IN THE HOSPITAL:  She was admitted, started individual and group  psychotherapy.  As already stated, she just left the unit on Friday.  Grand kids came to see her.  By the weekend, she was feeling down,  trying to connect.  Admits that she started hallucinating, called the  daughter, used cocaine, smoking cocaine, crack.  Endorsed decreased  sleep.  Some things got better.  She got her job back.  Endorsed concern  of the state of her mood and how could she be able to function if she  was not able to get her job.  On January 22, 2009, she was focusing on her  legs being swollen.  January 22, 2009, she was still trying to settle down,  anxious, hyperthymic, expansive, did not sleep.  We decided to use  Seroquel as a mood stabilizer.  There was a family session with the  daughter.  She endorsed she felt she was on too many medications and  that was the problem.  She endorsed willingness to go into a long-term  treatment facility but not willing to stop taking the Vicodin or the  Ativan.  The daughter felt that she was abusing her Vicodin.  Endorsed  that she has pain of her arthritis.  Daughter expressed support.  January 24, 2009, continued to settle down, less expansive, wanted to pursue the  medication, felt that the Seroquel was helping.  By January 24, 2009, she  developed  diarrhea, as well as nausea, possibility of having caught a  virus.  Feeling  tired and ran over.  Continued to endorse anxiety, not sure of how  things were going to go when she got out of the hospital.  Endorsed some  confusion.  The next several days she continued to endorse the diarrhea.  February 01, 2009, physically better but worrying about being in the house  all alone.  February 04, 2009, she was upset because she has not heard  from the daughter after the initial family session where it seemed that  things were going to be better.  Endorsed that she felt lonely because  there was no communication but was denying any suicidal/homicidal  ideations and physically she was getting better.  The next few days we  continued to stabilize.  She was again reassessed due to her diarrhea  February 08, 2009.  She was sent to the ED for IV fluids February 08, 2009.  Endorsed that mentally she was better.  Still some diarrhea but  otherwise she felt she was going to be ready to go home in the morning.  February 09, 2009, she was in full contact with reality.  Mood improved.  Affect brighter, broader.  No suicidal ideations.  Still some diarrhea  going on but overall feeling better, feeling that she could handle  things on an outpatient basis.   DISCHARGE DIAGNOSES:  AXIS I:  1. Bipolar disorder.  2. Cocaine abuse.  AXIS II:  No diagnosis.  AXIS III:  1. Chronic obstructive pulmonary disease.  2. Chronic low back pain.  3. Status post viral diarrhea, vomiting.  AXIS IV:  Moderate.  AXIS V:  Upon discharge 50 to 55.   Discharged on:  1. Trazodone 200 mg at bedtime.  2. Seroquel 100 mg at bedtime 50 three times a day.  3. Zofran 4 mg twice a day for the next 5 days.  4. Ativan 1 mg twice a day.   FOLLOWUP:  At [facility].      Geoffery Lyons, M.D.  Electronically Signed     IL/MEDQ  D:  02/22/2009  T:  02/22/2009  Job:  [id number]

## 2011-03-03 NOTE — H&P (Signed)
[facility]  Patient:    [name], LABO Visit Number: [id number] MRN: [id number]          Service Type: GYN Location: 1S 0006 01 Attending Physician:  Lendon Colonel Dictated by:   Kathie Rhodes. Kyra Manges, M.D. Admit Date:  01/29/2002                           History and Physical  CHIEF COMPLAINT:  Pelvic pressure, mild stress urinary incontinence.  HISTORY OF PRESENT ILLNESS:  [name] is a 55 year old gravida 4, para 3, female who presents with mild stress urinary incontinence.  She also has pelvic pressure and feeling that things are falling out.  She has had a history of an anterior repair in the past.  She has had an abdominal hysterectomy and removal of her right tube and ovary at age 20.  She has had a right inguinal hernia repaired in October of last year.  Because of the continued symptoms of pelvic pressure, she is desirous of a pelvic repair for a cystocele and large rectocele.  REVIEW OF SYSTEMS:  HEENT:  She denies frequent headaches or dizziness.  No decrease in visual or auditory acuity.  HEART:  No history of rheumatic fever. No history of heart murmur or hypertension.  LUNGS:  No chronic cough.  She smokes about three-quarters of a pack of cigarettes a day.  GENITOURINARY: She has a mild stress incontinence and pelvic pressure.  No recurrent UTIs. GASTROINTESTINAL:  Negative.  No bowel habit changes, no weight loss, no melena, no anorexia.  She does have difficulty with defecation.  MUSCLES, BONES, AND JOINTS:  No fractures or arthritis.  SOCIAL HISTORY:  She works at [employer].  She smokes three-fourths of a pack of cigarettes a day.  FAMILY HISTORY:  Her mother died of cancer of the lung.  She has one brother who is living and well and one sister.  Her maternal grandmother has a heart murmur, and an uncle had prostate cancer.  Her maternal grandfather is diabetic.  PHYSICAL EXAMINATION:  VITAL SIGNS:  Weight 120,  blood pressure 110/70.  GENERAL:  A well-developed and nourished female in no acute distress.  She appears to be her stated age.  HEENT:  The pupils are round and regular and reactive to light and accommodation.  NECK:  Supple.  Thyroid is not enlarged.  Carotid pulses are equal without bruits.  Trachea is in the midline, and I appreciate no adenopathy.  BREASTS:  No masses or tenderness.  Axillae negative.  CHEST:  Lungs are clear to P&A.  Diaphragms move well with inspiration.  CARDIAC:  Normal sinus rhythm, no murmurs, no heaves, thrills, rubs, or gallops.  ABDOMEN:  Soft.  Liver, spleen, and kidneys are not palpated.  Bowel sounds are normal.  Femoral pulses are equal.  EXTREMITIES:  Good range of motion and equal pulses and reflexes.  PELVIC:  Apparently a hypermobile urethra.  There is a second degree cystocele present.  The apex of the vault appears to be well-supported.  The rectocele is present.  There is a moderate amount of pelvic diaphragm distortion at the level of the rectocele.  Hemoccult is negative.  No pelvic masses are noted.  IMPRESSION:  Mild stress incontinence with pelvic pressure and large rectocele.  PLAN:  Burch procedure and posterior repair, exam under anesthesia.  Risks, failure rates, and dyspareunia have been discussed with patient. Dictated by:   S.  Kyra Manges, M.D. Attending Physician:  Lendon Colonel DD:  01/29/02 TD:  01/29/02 Job: [id number]

## 2011-03-03 NOTE — Op Note (Signed)
NAME:  [name], [name]                         ACCOUNT NO.:  [id number]   MEDICAL RECORD NO.:  [id number]                   PATIENT TYPE:  OBV   LOCATION:  9311                                 FACILITY:  [facility]   PHYSICIAN:  Tracie Harrier, M.D.              DATE OF BIRTH:  07-24-1956   DATE OF PROCEDURE:  09/19/2002  DATE OF DISCHARGE:                                 OPERATIVE REPORT   PREOPERATIVE DIAGNOSIS:  Pelvic pain.   POSTOPERATIVE DIAGNOSIS:  Pelvic adhesions.   PROCEDURES:  1. Laparoscopic bilateral salpingo-oophorectomy.  2. Lysis of adhesions.   SURGEON:  Tracie Harrier, M.D.   ANESTHESIA:  General.   ESTIMATED BLOOD LOSS:  Less than 20 cc.   COMPLICATIONS:  None.   FINDINGS:  At time of laparoscopy, two very small postmenopausal ovaries  were encountered.  There were pelvic adhesions to the left ovary from the  sigmoid colon adherent to the ovary.  The bowel appeared to be normal.  the  appendix was not visualized.   DESCRIPTION OF PROCEDURE:  The patient was taken to the operating room,  where a general endotracheal anesthetic was administered.  The patient was  placed on the operating table in the dorsal lithotomy position.  The  abdomen, perineum, and vagina were prepped and draped in the usual sterile  fashion with Betadine and sterile drapes.  The bladder was emptied with a  red rubber catheter.  Next, a sponge stick with two sponges was placed  inside the vagina to locate the vaginal cuff during the procedure.  Next, a  small vertical infraumbilical skin incision was made, through which a Veress  needle was inserted atraumatically into the abdominal cavity.  Carbon  dioxide gas was then used to insufflate the abdomen to a pressure of 15  mmHg.  The Veress needle was then removed and a disposable laparoscopic  trocar was inserted atraumatically into the abdominal cavity.  A secondary  puncture was placed two fingerbreadths above the pubic symphysis,  and  through this a 5 mm trocar was placed under direct, continuous laparoscopic  guidance.  The above-noted findings were noted.  The left ovary was first  removed.  This was grasped and elevated into the anterior portion of the  surgical field.  The infundibulopelvic ligament was cauterized and the  underlying tissue was also cauterized, and this was dissected with sharp  dissection away from the left pelvic sidewall.  Adhesions were lysed using  sharp dissection to isolate and normalize the pelvic anatomy.  The ovary was  removed in total and done easily and without complications.  Good hemostasis  was noted.  The same procedure was used on the right ovary.  This was  grasped and also elevated anteriorly and superiorly into the surgical field.  The infundibulopelvic ligament was then cauterized and the ovary dissected  free sharply.  The ovaries were then removed through  the umbilical site with  ease.  The pelvis was then thoroughly irrigated with a Nezhat-Dorsey  irrigator.  Good hemostasis was noted.  Attention was then turned to  closure.   All abdominal instruments were removed, and the gas was allowed to  completely escape from the abdomen.  The umbilical site was closed, first  with two interrupted sutures of 0 Vicryl on the muscle fascia.  The skin was  reapproximated with Dermabond glue.  The vaginal sponge forceps was removed.  The patient was awakened, extubated, and taken to the recovery room in good  condition.  There were no perioperative complications.                                               Tracie Harrier, M.D.    REG/MEDQ  D:  09/19/2002  T:  09/19/2002  Job:  [id number]

## 2011-03-03 NOTE — H&P (Signed)
NAMEMARIGENE, [name] NO.:  [id number]   MEDICAL RECORD NO.:  [id number]          PATIENT TYPE:  EMS   LOCATION:  MAJO                         FACILITY:  [facility]   PHYSICIAN:  Hollice Espy, M.D.DATE OF BIRTH:  1956-07-18   DATE OF ADMISSION:  07/17/2005  DATE OF DISCHARGE:                                HISTORY & PHYSICAL   CHIEF COMPLAINT:  Chest pain.   HISTORY OF PRESENT ILLNESS:  The patient is a 55 year old white female with  a past medical history of tobacco abuse who presents to the emergency room  after 5-7 days of worsening intermittent episodes of chest pain.  She  describes the chest pain as just left lateral to the mid sternum, described  it initially as heavy pressure and squeezing and then changing more to a  sharp and stabbing.  It radiated underneath her arm into her back on the  left side.  She also complained of some associated shortness of breath.  She  denied any radiation to her arm, no nausea, vomiting, or light headedness.  The pain was described as intermittent in nature, sometimes lasting for  several minutes, sometimes longer, sometimes the pain is described as a 2-3  out of 10, sometimes worse to an 8-9 out of 10.  She said that prior to  several days ago when this started, she has never had anything really like  this before.  She became concerned and came to the emergency room  a few  days back and was told that this may be secondary to bronchospasm and  recommended Albuterol inhaler, however, she stated she already had one at  home from a previous evaluation six months ago, but she says it makes no  difference and does not affect her  breathing or her chest pain.  The  patient came into the emergency room.  In the emergency room, she was noted  to have a normal D-dimer and negative chest x-ray.  The initial set of  cardiac enzymes were negative, and the rest of her lab work was  unremarkable, as well.  Currently, the patient is  complaining of some  intermittent episodes of about a 5 out of 10 sharp, stabbing mid sternal  chest pain, almost like shocks.  She said the heavy pressure which occurred  earlier is not occurring now.  She denies any headaches, vision changes,  dysphagia.  She denies any coughing, productive or otherwise, or any  wheezing.  No abdominal pain, no hematuria, dysuria, constipation, focal  extremity numbness, weakness or pain.  Review of systems otherwise negative.   PAST MEDICAL HISTORY:  Significant for tobacco abuse.   MEDICATIONS:  None.   ALLERGIES:  None.   SOCIAL HISTORY:  She smokes about 1/2 pack a day for the last 34 years.  She  denies drugs or alcohol use.   FAMILY HISTORY:  Notable for a sister who died of an MI at age 75, however,  she had a complication of lung cancer.  She had a father who has had MI in  his 46s.   PHYSICAL EXAMINATION:  VITAL SIGNS:  On admission, temperature 96.8, heart rate 75, blood pressure  111/81, respirations 20, O2 saturation 98% initially on room air, now down  to 92% on room air.  GENERAL:  The patient is alert and oriented x 3.  HEENT:  Normocephalic, atraumatic, mucous membranes moist.  NECK:  No carotid bruits.  HEART:  Regular rate and rhythm, S1 and S2.  LUNGS:  Clear to auscultation bilaterally.  ABDOMEN:  Soft, nontender, nondistended, positive bowel sounds.  EXTREMITIES:  No cyanosis, clubbing, and edema.   LABORATORY DATA:  Chest x-ray negative.  Sodium 139, potassium 3.9, chloride  108, bicarb 28, BUN 8, creatinine 0.8.  Glucose 87.  The first set of  enzymes 37.1, MB less than 1, troponin less than 0.05.  Second set, similar.  Third set, CPK 42, MB less than 1, troponin less than 0.05.  D-dimer  negative at 0.22.   ASSESSMENT AND PLAN:  Chest pain.  Essentially ruled out a pulmonary  etiology.  I do not feel that this is a pneumonia, her chest x-ray is  negative, or a pulmonary embolus, her D-dimer is negative.  We did note  her  drop in O2 saturations and to be thorough, will go ahead and check an  arterial blood gas.  This may be more musculoskeletal, however, with the  history and radiation of the pain, will go ahead and check a cardiac stress  test in the morning, I have already spoke to [facility] who is on  call and will do the test.  In the meantime, will put her in a telemetry bed  and observe her.  To address tobacco abuse, will provide tobacco counseling.      Hollice Espy, M.D.  Electronically Signed     SKK/MEDQ  D:  07/17/2005  T:  07/17/2005  Job:  [id number]

## 2011-03-03 NOTE — Op Note (Signed)
[facility]  Patient:    [name], [name] Visit Number: [id number] MRN: [id number]          Service Type: GYN Location: 4W 0456 01 Attending Physician:  Lendon Colonel Dictated by:   Kathie Rhodes. Kyra Manges, M.D. Proc. Date: 01/29/02 Admit Date:  01/29/2002                             Operative Report  PREOPERATIVE DIAGNOSES: 1. Stress incontinence. 2. Pelvic pressure. 3. Rectocele.  POSTOPERATIVE DIAGNOSES: 1. Stress incontinence. 2. Pelvic pressure. 3. Rectocele.  OPERATION: 1. Exam under anesthesia. 2. Burch procedure. 3. Posterior repair.  DESCRIPTION OF PROCEDURE:  The patient was placed in lithotomy position and prepped and draped in the usual fashion.  Examination under anesthesia was accomplished after suitable anesthesia was administered.  No pelvic masses were noted.  There was a large rectocele present and about a second-degree cystocele.  The patient was prepped and draped, transverse incision was made in the abdomen.  The retropubic space was entered, and a Burch procedure was performed using 0 Ethibond suturing the vagina to the shelving edge of Coopers ligament.  On the right, where she had had an inguinal hernia, there was a moderate amount of scarring.  The retropubic space was then irrigated. The fascia was closed with 2-0 PDS and 2-0 Vicryl.  The skin was closed with 4-0 PDS.  It went down below and incised in the perineal body and dissected the vagina from the underlying pubocervical vaginal fascia.  The fascia laterally was a little irregular and torn, but we were able to identify enough to plicate in the midline for repair of the large rectocele; 3-0 and 4-0 Vicryl were used for this.  The vagina was then closed with a locking suture of 2-0 chromic, following which I was able to excise some small part of the vagina and then closed it with interrupted sutures of 3-0 Vicryl.  The remaining vagina was closed with a locking  suture of 2-0 chromic.  In the perineum, I did a perineoplasty by approximating the bulbocavernosus muscle and shortening the transverse perinei muscle to effect a good perineal support.  The patient tolerated the procedure well.  Marcaine 20 cc with epinephrine was instilled into the repair, 10 cc on each side.  Foley was draining clear urine.  She was then awakened and carried to the recovery room. Dictated by:   S. Kyra Manges, M.D. Attending Physician:  Lendon Colonel DD:  01/29/02 TD:  01/30/02 Job: [id number]

## 2011-03-03 NOTE — H&P (Signed)
NAME:  [name], [name]                         ACCOUNT NO.:  [id number]   MEDICAL RECORD NO.:  [id number]                   PATIENT TYPE:  AMB   LOCATION:  DSC                                  FACILITY:  [facility]   PHYSICIAN:  Hermelinda Medicus, M.D.                DATE OF BIRTH:  Jan 09, 1956   DATE OF ADMISSION:  11/25/2002  DATE OF DISCHARGE:                                HISTORY & PHYSICAL   HISTORY:  This patient is a 55 year old female who was working at [employer].  She has had long experience at [employer], and also at the [employer] in  [place], in [place].  She has had some nasal trauma secondary to  her work and has a nasal deviation, but also has a severe septal deviation.  She now enters having a history of nasal fracture for a septal  reconstruction, turbinate reduction.  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  Her septum is primarily deviated to the  left.   PAST MEDICAL HISTORY:  Her past history is quite unremarkable.   ALLERGIES:  She has no allergies to medications.   SOCIAL HISTORY:  She does smoke one pack a day.   PAST SURGICAL HISTORY:  1. Bladder surgery on 4/03.  2. Hernia repair on 2/02.  3. Hysterectomy in 1982.   REVIEW OF SYSTEMS:  She has no other health problems, pulmonary, cardiac, GI  are all within normal limits.   PHYSICAL EXAMINATION:  VITAL SIGNS:  Blood pressure 94/72, pulse 89, weight  120, height 5 feet 1 inch.  HEENT:  The ears are clear.  The oral cavity is clear.  The larynx is clear,  true cords, false cords, epiglottis, base of tongue are clear of any  ulceration or mass.  NECK:  Free of any thyromegaly, cervical adenopathy or mass.  NOSE:  Shows a severe septal deviation to the left, with a concha bullosa on  the right with nasal obstruction.  She also has some nasal deviation with a  dorsal hump.  CHEST:  Clear, no rales,  rhonchi or wheezes.  CARDIOVASCULAR:  No wheeze, rubs, murmurs or gallops.  ABDOMEN:  Free of any organomegaly, tenderness or mass.  EXTREMITIES:  Unremarkable.   INITIAL DIAGNOSES:  1. Septal deviation.  2. Turbinate hypertrophy.  3. History of nasal trauma.  4. History of nasal dorsal deviation.  5.     History of bladder repair.  6. History of hernia repair.  7. History of hysterectomy in 1982.                                               Hermelinda Medicus, M.D.  JC/MEDQ  D:  11/25/2002  T:  11/25/2002  Job:  [id number]

## 2011-03-03 NOTE — Op Note (Signed)
[name], WESSMAN               ACCOUNT NO.:  [id number]   MEDICAL RECORD NO.:  [id number]          PATIENT TYPE:  AMB   LOCATION:  DAY                           FACILITY:  [facility]   PHYSICIAN:  Lionel December, M.D.    DATE OF BIRTH:  05-24-1956   DATE OF PROCEDURE:  11/11/2004  DATE OF DISCHARGE:                                 OPERATIVE REPORT   PROCEDURE:  Colonoscopy.   INDICATION:  [name] is 55 year old Caucasian female with constipation and  rectal pain described to be constant and severe as well as right lower  quadrant pain. Family history is positive for various cancers. Second degree  relative has had colon cancer. She is undergoing colonoscopy primarily for  diagnostic purposes. Procedure risks were reviewed with the patient, and  informed consent was obtained.   PREMEDICATION:  Demerol 25 mg IV, Versed 14 mg IV in divided dose.   FINDINGS:  Procedure performed in endoscopy suite. The patient's vital signs  and O2 saturations were monitored during procedure and remained stable. The  patient was placed in left lateral position and rectal examination  performed. No abnormality noted on external or digital exam. Olympus video  scope was placed rectum and advanced under vision into sigmoid colon beyond.  Preparation was satisfactory. She had moderate number of small to medium  size diverticula at sigmoid colon. She had a few scattered diverticula  throughout the colon all the way to the ascending colon. Cecal landmarks  were well seen. Pictures taken of appendiceal orifice and ileocecal valve.  As the scope was withdrawn, colonic mucosa was carefully examined, and there  no polyps and/or tumor masses. Rectal mucosa similarly was normal. Scope was  retroflexed to examine anorectal junction. It was well seen, and there was  no evidence of tear. Endoscope was then straightened and withdrawn. The  patient tolerated the procedure well.   FINAL DIAGNOSIS:  Pan colonic  diverticulosis. Most of the diverticula are at  the sigmoid colon.   Suspect she has irritable bowel syndrome.   RECOMMENDATIONS:  1.  High-fiber diet.  2.  Citrucel 1 tablespoonful daily.  3.  Levbid 1/2 tablet p.o. b.i.d.  4.  Colace 2 tablets at bedtime.  5.  She is asked to keep stool diary as to frequency and consistency of      stools.  6.  We also go ahead and obtain hepatitis C virus antibody, hepatitis B      surface antigen, hepatitis B surface antibody, and hepatitis B core      antibody given elevated ALT now and in the past.  7.  She will return for OV in 8 weeks from now.      NR/MEDQ  D:  11/11/2004  T:  11/11/2004  Job:  [id number]

## 2011-03-03 NOTE — H&P (Signed)
   NAME:  [name], [name]                         ACCOUNT NO.:  [id number]   MEDICAL RECORD NO.:  [id number]                   PATIENT TYPE:  OBV   LOCATION:  9311                                 FACILITY:  [facility]   PHYSICIAN:  Tracie Harrier, M.D.              DATE OF BIRTH:  04/05/1956   DATE OF ADMISSION:  09/19/2002  DATE OF DISCHARGE:                                HISTORY & PHYSICAL   HISTORY OF PRESENT ILLNESS:  The patient is a 55 year old female status post  total abdominal hysterectomy in the past.  She is admitted at this time to  undergo laparoscopy and bilateral salpingo-oophorectomy for pelvic pain.  She has been extremely uncomfortable in the pelvic region recently.  There  is a cystic area on the left ovary but the right ovary is normal.  After  consultation and discussion regarding different treatment options she has  elected to undergo laparoscopic bilateral salpingo-oophorectomy.   PAST MEDICAL HISTORY:  History of tobacco abuse.   PAST SURGICAL HISTORY:  1. Total abdominal hysterectomy 1983.  2. Bladder repair of questionable type 2002.  3. Inguinal hernia repair 2002.  4. Normal spontaneous vaginal delivery x4.   OB HISTORY:  Normal spontaneous vaginal delivery x4.   CURRENT MEDICATIONS:  Tylenol No.3 p.r.n.   ALLERGIES:  None known.   PHYSICAL EXAMINATION:  VITAL SIGNS:  Stable.  Temperature 96, pulse 80,  respirations 20, blood pressure 100/70.  GENERAL:  She is a well-developed, well-nourished female in no acute  distress.  HEENT:  Within normal limits.  NECK:  Supple without adenopathy or thyromegaly.  HEART:  Regular rate and rhythm without murmur, rub, or gallop.  LUNGS:  Clear with decreased breath sounds throughout.  BREASTS:  Done recently in the office was normal, but deferred upon  admission.  ABDOMEN:  Soft and benign without masses, tenderness, hernia, or  organomegaly.  EXTREMITIES:  Grossly normal.  NEUROLOGIC:  Grossly normal.  PELVIC:   Normal external female genitalia noted.  Vagina clear.  The uterus  is surgically absent.  The adnexa is clear of mass.  There is mild bilateral  pelvic tenderness noted without peritoneal signs or mass.   ADMITTING DIAGNOSES:  Pelvic pain.   PLAN:  Laparoscopic bilateral salpingo-oophorectomy.    DISCUSSION:  The risks and benefits of this surgery explained to patient.  The risk of bleeding, infection, risk of injury to surrounding organs was  reviewed.  Also, the limitations of this surgery reviewed.                                               Tracie Harrier, M.D.    REG/MEDQ  D:  09/19/2002  T:  09/20/2002  Job:  [id number]

## 2011-03-03 NOTE — Op Note (Signed)
[facility]  Patient:    [name], [name] Visit Number: [id number] MRN: [id number]          Service Type: GYN Location: 9300 [id number] [id number] Attending Physician:  Pleas Koch Dictated by:   Georgina Peer, M.D. Proc. Date: 06/21/01 Admit Date:  06/21/2001                             Operative Report  PREOPERATIVE DIAGNOSIS:       Symptomatic cystocele.  POSTOPERATIVE DIAGNOSIS:      Symptomatic cystocele.  OPERATION:                    Anterior colporrhaphy, suprapubic cystotomy.  SURGEON:                      Georgina Peer, M.D.  ASSISTANT:                    Raynald Kemp, M.D.  ANESTHESIA:                   General.  ESTIMATED BLOOD LOSS:         75 cc.  COMPLICATIONS:                None.  INDICATIONS:                  A 55 year old white female who had a previous vaginal hysterectomy, previous anterior colporrhaphy but had a recurrent bulge in the vagina which was painful and caused pressure.  She is brought in for repair.  For details, see history and physical.  DESCRIPTION OF PROCEDURE:     The patient was taken to the operating room. She received Ancef 1 g preoperatively and had PAS stockings placed before traveling to the operating room.  She was given a general anesthetic and placed in the dorsal lithotomy position with legs in [place] stirrups.   Her peritoneum and suprapubic area was prepped and draped in the normal sterile fashion.  Cystocele was identified, Foley catheter placed in her bladder. The vaginal apex was well supported.  Allis clamps identified the vaginal apex and incision in the mucosa was made and 9 cc of 1% Xylocaine with epinephrine was placed into this submucosal area and progressive incision up to 1 cm below the urethra meatus was made.  Mucosa dissected off the submucosa.  A good plane resulted and there was minimal bleeding.  The cystocele was thus reduced by sharp and blunt dissection.  Pursestring  sutures reduced the major part of the cystocele and then figure-of-eight interrupted sutures were used to bolster the base of the bladder.  A Kelly plication stitch was now found to be needed but the dissection did go up to the urethrovesical angle.  The excess mucosa was trimmed and the vaginal mucosa was reapproximated with a running stitch of chromic suture.  Suprapubic catheter was placed filling the bladder up with 500 cc of saline.  A Bonnano suprapubic catheter was placed without difficulty.  The urethra catheter was removed after urinary fluid was removed. The catheter was held in place with silk sutures.  The vaginal was packed with plain gauze with Estrace cream.  Sponge, needle and instrument counts were correct and the patient was returned to the recovery room in stable condition. Dictated by:   Georgina Peer, M.D. Attending Physician:  Pleas Koch  DD:  06/21/01 TD:  06/22/01 Job: [id number]

## 2011-03-03 NOTE — Group Therapy Note (Signed)
NAMEREMY, DIA NO.:  [id number]   MEDICAL RECORD NO.:  [id number]          PATIENT TYPE:  WOC   LOCATION:  [facility]                   FACILITY:  [facility]   PHYSICIAN:  Argentina Donovan, MD        DATE OF BIRTH:  1955-10-31   DATE OF SERVICE:  07/26/2004                                    CLINIC NOTE   The patient is a 55 year old white female who underwent anterior and  posterior colporrhaphy about five years ago.  Has done well since then.  She  had had previously a total abdominal hysterectomy, bilateral salpingo-  oophorectomy for reasons she is not sure of.  Said she went in for an  ovarian cyst and came out with a hysterectomy.  She has never been on  estrogen replacement.  Her problem is that three weeks ago she was walking  with her grandchild in a store, had lifted her up, and felt a tear.  Since  then when she wipes she wipes her bladder which has come completely out of  the vagina.   PHYSICAL EXAMINATION:  PELVIC:  On examination it appears that she has a  third degree cystocele, reducible, but the apex of the vagina seems fairly  stable as does the posterior vaginal wall.  She has few symptoms of  incontinence or stress incontinence, but mainly the terrible pressure and  the dyspareunia since that time.  The vaginal mucosa is quite thinned  secondary to long-term atrophic vaginitis.  ABDOMEN:  Soft, flat, nontender.  No masses.  No organomegaly.  She has had  a recent abdominopelvic CAT scan that ruled out any problem that may have  been causing pressure from above.  We are going to refer this patient to a  urogynecologist.   IMPRESSION:  Third degree cystocele, recurrent.      PR/MEDQ  D:  07/26/2004  T:  07/27/2004  Job:  [id number]

## 2011-03-03 NOTE — H&P (Signed)
NAMEBOSTON, CATARINO               ACCOUNT NO.:  [id number]   MEDICAL RECORD NO.:  [id number]          PATIENT TYPE:  INP   LOCATION:  A428                          FACILITY:  [facility]   PHYSICIAN:  Langley Gauss, MD     DATE OF BIRTH:  21-Jul-1956   DATE OF ADMISSION:  10/05/2005  DATE OF DISCHARGE:  12/23/2006LH                                HISTORY & PHYSICAL   HISTORY:  This is a 55 year old patient who is admitted for anterior and  posterior repair. The patient is noted to have a second-degree symptomatic  cystocele without stress urinary incontinence, specifically the anterior  bulge has been worsening over the past 6 months and on several occasions she  has actually had to digitally manipulate it to push it back into the vagina.  There is also a feeling of significant pressure in this area. Additionally  she is noted to have a symptomatic rectocele, she does experience occasional  constipation and has found that digital manipulation of the posterior  vaginal wall does facilitate onset of the bowel movement thus she is  admitted for both anterior and posterior repair. She does not have any  stress urinary incontinence thus no urethral suspension procedure is  indicated.   PAST MEDICAL HISTORY:  She did have a previous vaginal hysterectomy in 1981  as well as an anterior repair, she does have a history of right femoral  hernia repair, has a history of umbilical hernia repair in 2006.   REVIEW OF SYSTEMS:  Pertinent for no stress urinary incontinence, no bloody  stools, no diabetes or hypertension, no psychiatric history.   ALLERGIES:  NO KNOWN DRUG ALLERGIES.   CURRENT MEDICATIONS:  Current medications include hydrocodone on a p.r.n.  basis only. Likewise, she has been treated 3 weeks preoperatively with  Premarin cream applied locally.   PHYSICAL EXAMINATION:  GENERAL:  She is a cigarette smoker, pleasant white  female, 5 feet 1 inch, 103 pounds, blood pressure 101/75, pulse  of 86,  respiratory rate is 14, temperature 97.1.  HEENT:  Negative. No adenopathy. Neck is supple. Thyroid nonpalpable.  CHEST:  Lungs clear. Cardiovascular exam is regular rate and rhythm.  ABDOMEN:  Abdomen is soft and nontender, umbilical hernia scar only.  EXTREMITIES:  Within normal limits.  PELVIC EXAM:  Normal external genitalia, some atrophic changes noted due to  estrogen deficiency. The cystocele is easily visible externally and is noted  to descend further with Valsalva maneuver. Vaginal cuff is well supported  with no evidence of enterocele. There is a mild rectocele also present.  Tissues initially were poorly estrogenized but has had an excellent response  to the local application of the Premarin cream.   ASSESSMENT AND PLAN:  Risks and benefits of surgery are discussed with the  patient to include risks of bowel or bladder injury. She is not sexually  active but did discuss with her that there is typically some shortening and  narrowing of the vagina with anterior and posterior repair but this is not a  worrisome thing to her and she does not have any plans for  sexual activity.  Also discussed with the patient use of synthetic nonabsorbable mesh for the  anterior and posterior repair for added strength to the fascial layer.      Langley Gauss, MD  Electronically Signed     DC/MEDQ  D:  10/07/2005  T:  10/07/2005  Job:  [id number]

## 2011-03-03 NOTE — Op Note (Signed)
NAMETRINA, ASCH               ACCOUNT NO.:  [id number]   MEDICAL RECORD NO.:  [id number]          PATIENT TYPE:  AMB   LOCATION:  DAY                           FACILITY:  [facility]   PHYSICIAN:  Bernerd Limbo. Destefano, M.D.DATE OF BIRTH:  1956-05-18   DATE OF PROCEDURE:  01/16/2005  DATE OF DISCHARGE:  01/16/2005                                 OPERATIVE REPORT   PREOPERATIVE DIAGNOSIS:  Ventral hernia.   POSTOPERATIVE DIAGNOSIS:  Ventral hernia.   OPERATION PERFORMED:  Repair of a ventral hernia.   SURGEON:  Bernerd Limbo. Leona Carry, M.D.   COMPLICATIONS:  None.   OPERATIVE PROCEDURE:  Under adequate general anesthesia, the patient was  prepped and draped in the usual manner and a right rectus incision was made  alongside of the umbilicus.  This was carried through the subcutaneous  tissue to the surface of the anterior rectus sheath.  The anterior rectus  sheath was incised, the rectus muscle pulled laterally, the posterior rectus  sheath incised and the abdomen opened.  There was noted to be a defect that  measured about probably 2 cm in the midline of the abdomen, consistent with  a ventral hernia.  The peritoneal cavity was then closed with a continuous  locking #1 Vicryl.  The rectus muscles and anterior rectus sheath were  closed with a continuous suture of 2-0 Novofil.  The subcutaneous tissue was  closed with continuous 3-0 Vicryl.  The skin was closed with skin clips,  Telfa, OpSite dressing was applied.  The patient tolerated the procedure  nicely.  Blood loss 5 or 10 mL.      NMD/MEDQ  D:  01/20/2005  T:  01/20/2005  Job:  [id number]

## 2011-03-03 NOTE — Op Note (Signed)
NAMEEMMAMARIE, KLUENDER               ACCOUNT NO.:  [id number]   MEDICAL RECORD NO.:  [id number]          PATIENT TYPE:  INP   LOCATION:  9399                          [facility]ACILITY[facility]   PHYSICIAN:  Randye Lobo, M.D.   DATE OF BIRTH:  Mar 02, 1956   DATE OF PROCEDURE:  09/15/2004  DATE OF DISCHARGE:                                 OPERATIVE REPORT   PREOPERATIVE DIAGNOSES:  Second degree cystocele.   POSTOPERATIVE DIAGNOSES:  Second degree cystocele.   PROCEDURE:  Anterior colporrhaphy with PelviSoft porcine dermis graft,  cystoscopy.   SURGEON:  Randye Lobo, M.D.   ASSISTANT:  Luvenia Redden, M.D.   ANESTHESIA:  General endotracheal.   IV FLUIDS:  1700 mL Ringer's lactate.   ESTIMATED BLOOD LOSS:  Less than 50 mL.   URINE OUTPUT:  450 mL.   COMPLICATIONS:  None.   INDICATIONS FOR PROCEDURE:  The patient is a 55 year old, gravida 4, para 105-  0-0-3 Caucasian female, status post total abdominal hysterectomy with  bilateral salpingo-oophorectomy and status post anterior and posterior  colporrhaphy, who presented with a complaint of urinary urgency and  dyspareunia. The patient was seen by her primary gynecologist, Dr. Elinor Dodge  at the [facility] where she was diagnosed with a recurrent  cystocele.  The patient in the office was noted to have a history of  inconsistent leakage of urine with coughing and laughing. She therefore  underwent multichannel urodynamic testing preoperatively which documented  the absence of genuine stress incontinence.  On physical examination in the  office, the patient was noted to have a second degree cystocele.  The vagina  was noted to have good apical and posterior vaginal support.  The vagina was  noted to be shortened in length and with a two fingerbreadth maximum  diameter.   The patient desired surgical treatment of the cystocele and the plan was  made to proceed with an anterior colporrhaphy with the PelviSoft porcine  dermis graft and cystoscopy after risks, benefits, and alternatives were  discussed with her.   FINDINGS:  Examination under anesthesia revealed a second degree cystocele,  the vagina was noted to be foreshortened.  The apex and posterior vaginal  wall had good support. The cervix and the uterus were absent.   Cystoscopy documented patent ureters bilaterally. There was no evidence of a  foreign body in the bladder or urethra.   SPECIMENS:  None.   DESCRIPTION OF PROCEDURE:  The patient was reidentified in the preoperative  hold area. She did receive Ancef 1 g intravenously for antibiotic  prophylaxis. The patient was taken to the operating room where general  endotracheal anesthetic was administered.  She was then placed in the dorsal  lithotomy position and the vagina and the perineum were sterilely prepped  and draped. A Foley catheter was sterilely placed inside the bladder.   An examination under anesthesia was performed and the findings are as noted  above.  The midline of the anterior vaginal wall was marked with Allis  clamps and it was then injected with 0.5% Xylocaine with 1:200,000 of  epinephrine.  The anterior vaginal wall was then incised in the midline with  the scalpel from the base of the cystocele to approximately 1 cm below the  urethral meatus.  The endopelvic fascia was then dissected off of the  overlying vaginal mucosa bilaterally.  The dissection was carried back to  the pubic rami bilaterally.  The dissection was carried down to the level of  the vaginal apex.  The cystocele was reduced by placing vertical mattress  sutures of 2-0 Vicryl. The first suture was a Kelly-Kennedy plication  performed in standard fashion.  The series of these 2-0 Vicryl sutures  provided excellent reduction of the cystocele.  Cystoscopy was performed at  this time and the findings are as noted above.  The PelviSoft graft was then  trimmed to be placed over the cystocele after it was  soaked in normal saline  solution.  The PelviSoft graft was tacked over the site of the anterior  colporrhaphy with simple interrupted sutures of the 2-0 Vicryl.  This  provided an additional layer of support overlying the bladder. The edges of  the vaginal mucosa were then trimmed minimally and the anterior vaginal wall  was closed with interrupted sutures of 2-0 Vicryl.   The patient's Foley catheter was replaced and left to gravity drainage.  A  packing with Estrace cream was placed inside the vagina.  Hemostasis was  excellent.   The patient was cleansed of any remaining Betadine and taken out of the  dorsal lithotomy position. She was awakened and extubated and escorted to  the recovery room in stable and awake condition. There were no complications  to the procedure. All needle, instrument, and sponge counts were correct.     Broo   BES/MEDQ  D:  09/15/2004  T:  09/15/2004  Job:  [id number]   cc:   Javier Glazier. Okey Dupre, M.D.  Fax: [phone]

## 2011-03-03 NOTE — Op Note (Signed)
NAMEKAREMA, TOCCI               ACCOUNT NO.:  [id number]   MEDICAL RECORD NO.:  [id number]          PATIENT TYPE:  INP   LOCATION:  A428                          FACILITY:  [facility]   PHYSICIAN:  Langley Gauss, MD     DATE OF BIRTH:  Jan 15, 1956   DATE OF PROCEDURE:  10/05/2005  DATE OF DISCHARGE:  10/07/2005                                 OPERATIVE REPORT   PROCEDURE PERFORMED:  Anterior repair by colporrhaphy as well as posterior  repair. Additionally nonabsorbable mesh was placed just over the fascial  layer, both anteriorly and posteriorly.   SURGEON:  Dr. Langley Gauss.   ESTIMATED BLOOD LOSS:  250 cc. Vaginal packing is left in place following  the procedure. In addition, a Foley catheter was in place which was draining  clear yellow urine.   SUMMARY:  The patient was met in the preoperative holding area. Planned  procedure was discussed with the patient. Vital signs were stable. She was  taken to the operating room where she underwent uncomplicated induction of  general endotracheal anesthesia. She was then placed in the candy-cane  stirrups. She was examined at which time cystocele was noted as well as  rectocele. The patient has a sterilely prepped and draped. Allis clamps were  utilized to grasp laterally at the vaginal apex. A knife is used to excise  between the two. This then allows me to dissect in the avascular plane  between the anterior vaginal mucosa and the underlying pubic cervical  vesical fascia. This avascular dissection was performed to within 1/2 cm of  the urethral meatus. The dissection was then continued laterally in the  avascular plane, freeing up the redundant vaginal mucosa. Packing was then  placed. Posterior repair was then initiated at the posterior vaginal  vestibule. Allis clamps were placed laterally. Knife was used to excise  between the two which allows the avascular dissection between the posterior  vaginal mucosa and the underlying  pararectal fascia. This was continued to  the vaginal apex. The dissection was then continued laterally utilizing  blunt dissection in the avascular plane to free up the redundant posterior  vaginal mucosa. At this point in time, the fascial sutures are placed.  Horizontal mattress sutures of 2-0 Vicryl are utilized. Posterior repair was  performed first. Two-0 Vicryl mattress suture was placed laterally on the  right and on the left through the pararectal fascia. A series of three of  these were placed and tagged and then later tied together in the midline  which reduced the redundant fascial layer. Anterior repair was likewise done  in a similar manner with a series of four 2-0 Vicryl sutures being placed in  a vertical mattress fashion from the right lateral side to the left lateral.  Each of these were tagged and tied together in the midline with reduction of  the redundant fascia. Again previously stated, the dissection had continued  to 1/2 cm away from the urethra. Thus, the urethra was involved in the  repair. No evidence of any bladder injury has occurred, and at present,  excellent support was  noted. At this point in time, the biosynthetic  nonabsorbable mesh was utilized. Appropriate size was cut to just overlie  the exposed fascia. Posteriors performed initially. The four corners are  sutured into place with figure-of-eight suture of 2-0 Vicryl. Anterior was  likewise performed with the mesh being trimmed down to appropriate size, and  then at the four corners, figure-of-eight of 2-0 Vicryl suture placed.  Additionally at the mid portion of the lateral edges, an additional  interrupted 2-0 Vicryl suture was placed to hold the mesh in place. Excess  vaginal mucosa was then trimmed posteriorly. The posterior vaginal mucosa  was reapproximated in the midline utilizing continuous running 0 Vicryl  suture. Anteriorly, no redundant vaginal mucosa was excised to limit the  tension  applied over the mesh. The anterior vaginal mucosa was thus closed  with a continuous running 0 Vicryl suture. This completes the procedure.  Minimal active bleeding was noted to be occurring at this time. Excellent  cosmetic with no active bleeding noted. A vaginal packing was placed which  was  saturated with a MetroGel solution. Foley catheter was also placed which  results in drainage of 200 cc of clear yellow urine. The patient's vital  signs have remained stable. She was now reversed of anesthesia, taken to  recovery room in stable condition at which time operative findings discussed  with the patient's awaiting sister.      Langley Gauss, MD  Electronically Signed     DC/MEDQ  D:  10/07/2005  T:  10/09/2005  Job:  [id number]

## 2011-03-03 NOTE — Discharge Summary (Signed)
[facility]  Patient:    [name], [name] Visit Number: [id number] MRN: [id number]          Service Type: GYN Location: 9300 [id number] [id number] Attending Physician:  Pleas Koch Dictated by:   Georgina Peer, M.D. Admit Date:  06/21/2001 Discharge Date: 06/23/2001                             Discharge Summary  ADMISSION DIAGNOSIS:          Symptomatic cystocele.  DISCHARGE DIAGNOSIS:          Symptomatic cystocele.  REASON FOR ADMISSION:          The patient is a 55 year old white female with a recurrent symptomatic cystocele who was brought in for repair. For details see history.  HOSPITAL COURSE:              The patient underwent an anterior colporrhaphy and suprapubic cystotomy under general anesthesia under September 6 with no complications. Vaginal packing was placed and it was expelled on the day of surgery. There was no vaginal bleeding. The patient had minimal nausea and was hungry and was advanced to solid food on the evening of the day of surgery. She was on Toradol and PCA Dilaudid which was adequately controlling her pain. She was afebrile. On the first day her suprapubic catheter was clamped and she was voiding 150 cc to 400 cc with less than 25 cc residual. She had some nausea in the a.m. of day #1. She had some pain control issues and was switched to p.o. Dilaudid but it cost nausea and headache and she was switched back to Percocet. Her suprapubic catheter was removed on the second day, the second day needed because of pain control issues. She had passed flatus eating solid foods. She was ambulating. Her hemoglobin was 11.1 from preoperative 15.7. She had met all the requirements for discharge and was discharged in good condition.  DISCHARGE MEDICATIONS:        1. Percocet one to two every four to six hours                                  p.r.n.                               2. Motrin 600 mg every six to eight hours p.r.n.                             3. She was offered a nicotine patch to aid in                                  smoking cessation as the patient had smoked                                  in the hospital and it was thought that                                  possibly some of her symptoms may be due to  nicotine withdrawal. She declined this.  DISCHARGE FOLLOWUP:           She will follow up in the office in two weeks and was given a discharge sheet and verbal instructions.  Dictated by:   Georgina Peer, M.D. Attending Physician:  Pleas Koch DD:  06/23/01 TD:  06/23/01 Job: [id number]

## 2011-03-03 NOTE — H&P (Signed)
NAMEQUINCI, [name] NO.:  [id number]   MEDICAL RECORD NO.:  [id number]          PATIENT TYPE:  IPS   LOCATION:  0508                          [facility]ACILITY[facility]  [facility]   PHYSICIAN:  [name], M.D. DATE OF BIRTH:  1956-05-23   DATE OF ADMISSION:  05/12/2006  DATE OF DISCHARGE:  05/14/2006                         PSYCHIATRIC ADMISSION ASSESSMENT   HISTORY OF PRESENT ILLNESS:  The patient presents with a history of self-  inflicted injury to both her forearms using a knife yesterday at her home.  She stated that she was hoping to die. The patient states that the  daughter whom she has been living with for 9 years is now in a relationship  with another woman and threw her out. She states out of frustration she cut  herself thinking I can't take it anymore. She has lost about 40 pounds,  she has been noncompliant with her medications. She denies any alcohol or  drug use.   PAST PSYCHIATRIC HISTORY:  First admission at [facility]. She  sees Dr. Betti Cruz for outpatient mental health services. She was hospitalized  in [place] and states that she died there where she overdosed on valium  and Restoril approximately 2 years ago.   SOCIAL HISTORY:  She is a 55 year old, single, white female who has 3  children. She works at [employer] in [place]. The patient has  been a prior employee at our facility about 3 years ago.   FAMILY HISTORY:  None.   ALCOHOL AND DRUGS HISTORY:  The patient smokes, denies any alcohol or drug  use. Urine drug screen was positive for cocaine. The patient states that she  is not sure what had happened there thinking that maybe something had been  poured into her drink.   PRIMARY CARE PHYSICIAN:  None. She states that she does not like to see MDs.   MEDICAL PROBLEMS:  Questionable hypertension.   MEDICATIONS:  Has been on Valium and Restoril and lithium but has been off  of lithium for months and states they are  prescribed by Dr. Betti Cruz.   ALLERGIES:  No known allergies.   PHYSICAL EXAMINATION:  GENERAL:  The patient was assessed at [facility]. ER notes that the patient was very agitated and cursing and she  attempted to take an instrument which was a bone cutter and had to be  observed. The patient also attempted to leave. She is a thin middle-aged  female, appears somewhat agitated, tired appearing. Both arms are currently  dressed in Kerlix, there is no sign of bleeding.  Her urine drug screen was positive for benzodiazepines, positive for  cocaine. Her CMET was within normal limits. CBC was within normal limits.  Alcohol level less than 5. TSH is 1.595, urinalysis is negative.  MENTAL STATUS:  She is fully alert, cooperative, little eye contact, appears  somewhat agitated, requesting pain medications. Her speech is clear. The  patient is frustrated and hurting. The patient is again agitated, ruminating  about getting some pain medications. Thought processes are coherent, no  evidence of psychosis, no suicidal  or homicidal ideation. No evidence of  having any psychotic symptoms.  Cognition is intact.  Her memory is fair,  judgment is poor, insight is poor, poor impulse control. Questionable  historian.   AXIS I:  Mood disorder, cocaine abuse.  AXIS II:  Deferred.  AXIS III:  Status post lacerations both wrist.  AXIS IV:  Problems with primary support group.  AXIS V:  Currently is 30.   PLAN:  Stabilize mood and thinking.  Will add some Risperdal for agitation.  Will monitor wounds for signs and symptoms of infection. Will clarify the  patient's medications. Will also consider have a family session with the  patient's daughter. Tentative length of stay is 5-7 days.      Landry Corporal, N.P.      [name], M.D.  Electronically Signed    JO/MEDQ  D:  05/17/2006  T:  05/17/2006  Job:  [id number]

## 2011-03-03 NOTE — Op Note (Signed)
NAME:  [name], [name]                         ACCOUNT NO.:  [id number]   MEDICAL RECORD NO.:  [id number]                   PATIENT TYPE:  AMB   LOCATION:  DSC                                  FACILITY:  [facility]   PHYSICIAN:  Hermelinda Medicus, M.D.                DATE OF BIRTH:  March 28, 1956   DATE OF PROCEDURE:  DATE OF DISCHARGE:                                 OPERATIVE REPORT   PREOPERATIVE DIAGNOSES:  Septal deviation, history of nasal trauma, history  of nasal fracture, concha bullosa right.   POSTOPERATIVE DIAGNOSES:  Septal deviation, history of nasal trauma, history  of nasal fracture, concha bullosa right.   OPERATION:  Septal reconstruction, turbinate reduction, reduction of concha  bullosa.   ANESTHESIA:  General endotracheal with Dr. Gelene Mink,   SURGEON:  Hermelinda Medicus, M.D.   DESCRIPTION OF PROCEDURE:  The patient was placed in the supine position.  Under general endotracheal anesthesia, the nose was still anesthetized using  1% Xylocaine with epinephrine and topical cocaine 100 mg for hemostasis and  shrinking of the membranes.  Inferior turbinates were outfractured  aggressively to gain some space.  The middle turbinate on the right was also  crushed using the polyp forceps in an effort to correct a concha bullosa  status.  Once this was achieved and we could bring the septum back over to  the midline, a hemitransfixion incision was made on the right side, around  the columella to the left, and the mucoperichondrium was elevated back to  the ethmoid and vomerine septal deviation region.  A Glorious Peach was then used to  take a posterior quadrilateral cartilage strip along with the floor of the  nose inferior quadrilateral cartilage strip.  The open and closed Leane Para-  Middletons were then used to remove a very large section of the ethmoid  deviation, and then the vomerine deviation.  The 4-mm chisel was also used  to take down the part of the vomerine septum that was off  the premaxillary  crest.  Once this ethmoid septum and vomerine septum was brought back to the  midline, the quadrilateral cartilage was then set back on the premaxillary  crest in a symmetric way, and the septum was established in the midline.  The columella was partially pushing to the right where the ethmoid septum  was grossly deviated to the left.  We managed to straighten this whole  system where the septum would sit in the midline and give adequate support.  The closure was done was 5-0 plain catgut, through-and-through septal suture  with 4-0 plain intranasal x2.  Intranasal dressing was used using Merocel,  and the patient tolerated the procedure well and is doing well  postoperatively.  Followup will be tomorrow for removal of packing, then one  week, three weeks, and six weeks.  Hermelinda Medicus, M.D.    JC/MEDQ  D:  11/25/2002  T:  11/25/2002  Job:  [id number]

## 2011-03-03 NOTE — Discharge Summary (Signed)
NAMEANISHA, STARLIPER NO.:  [id number]   MEDICAL RECORD NO.:  [id number]          PATIENT TYPE:  IPS   LOCATION:  0508                          [facility]   PHYSICIAN:  Jasmine Pang, M.D. DATE OF BIRTH:  1956-09-06   DATE OF ADMISSION:  05/12/2006  DATE OF DISCHARGE:  05/18/2006                                 DISCHARGE SUMMARY   IDENTIFYING INFORMATION:  The patient was a 55 year old single Caucasian  female who was admitted on a voluntary basis on May 12, 2006 to my service.   HISTORY OF PRESENT ILLNESS:  The patient has a history of self-inflicted  injuries to both forearms.  She did this with a knife the day before  admission at home.  She stated she was hoping to die.  She states that her  daughter, with whom she has lived for the past nine years, is now in a  relationship with another woman.  She states they threw patient out, and she  cut herself because, I can't take it anymore.  She has lost 4 pounds in  the past several weeks.  There was no psychosis.  The patient's daughter  took her to the ED and dropped her off, according to the patient.  She is  very angry and hurt that her daughter is not more caring toward her.   PAST PSYCHIATRIC HISTORY:  This is the first psychiatric admission.  The  patient sees Dr. Betti Cruz.  Last appointment was last month.  She states she  was on Valium and Restoril two years ago.   FAMILY HISTORY:  The patient denies.   SUBSTANCE ABUSE HISTORY:  The patient smokes.  She denied alcohol.  She  denied drug use, but cocaine was found in her urine drug screen, and her  daughter also said her mother was using cocaine.  Daughter states she found  it in the house.   PAST MEDICAL HISTORY:  Back pain.   MEDICATIONS:  Valium, Restoril, lithium 600 mg p.o. b.i.d. times one month  by Dr. Betti Cruz.   DRUG ALLERGIES:  No known drug allergies.   PHYSICAL FINDINGS:  Both arms have superficial cuts on them.  The rest of  her  physical exam was done at [facility] emergency room.   ADMISSION LABORATORIES:  UDS was positive for benzodiazepines and cocaine.  CBC was within normal limits.  CMET was within normal limits.  TSH was  normal at 1.595.  Urinalysis was negative.  Alcohol level was less than 5.   HOSPITAL COURSE:  Upon admission, the patient was placed on Restoril 30 mg  p.o. q.h.s. and Vicodin one tablet p.o. every six hours p.r.n. pain.  She  was also placed on Valium 10 mg p.o. t.i.d., as she stated that she uses as  an outpatient.  On May 12, 2006, the patient was found to have been picking  at her wounds on her left wrist from self-inflicted injury.  She was sent to  the ED to have this evaluated.  It was ordered that she keep her wounds  clean and dry and  no antibiotics to the wound.  She was given a Vicodin  5/500 one p.o. now.  On May 13, 2006, a UDS was repeated at patient's  request.  She was upset when told there had been cocaine in her urine with  the first UDS.  There was cocaine in the second UDS too.  The patient  continued to state she had no idea how it got there, that she was not using.  On May 13, 2006, the patient was started on Skelaxin 800 mg p.o. t.i.d. and  Risperdal 0.25 mg p.o. q.h.s.  She was also started on Risperdal 0.25 mg  p.o. every six hours p.r.n.  She was also started on Vicodin 5/500, one  every six hours p.r.n. times 48 hours.  On May 13, 2006, the patient was  placed on hourly contracts due to her deliberately injuring the cuts on her  arms.  On May 14, 2006, Vicodin was discontinued.  Instead, Vicoprofen  7.5/200 p.o. every six hours p.r.n. was started.  She was also started on  Risperdal 0.25 mg t.i.d. and 0.5 mg at h.s.  On May 14, 2006, a CT scan of  the abdomen was ordered because of her distention and constipation, and this  was within normal limits.  On May 15, 2006, Ambien 10 mg p.o. q.h.s. p.r.n.  difficulty sleeping was ordered.  Later on May 15, 2006,  Ambien was  discontinued, and Ambien 20 mg p.o. q.h.s. as a standing order was started.  On May 15, 2006, the patient was given mag citrate, a 4-gram bottle times  one.  She was also given a Fleet enema.  She did not have results with this  treatment.  On May 16, 2006, she was given mag citrate, a 4-ounce bottle  times one, may repeat in four hours if no results.  There were still no  results from this.  On May 16, 2006, the patient was put on a clear liquid  diet only and given GoLYTELY 4 grams every two hours until her bowels moved.  She did get results from this.  On May 16, 2006, Vicoprofen was  discontinued.  It was decided she should have no opiates, as this was  probably contributing to her constipation.  On May 16, 2006, she was given  12.5 mg of Phenergan p.o. times one.  On May 17, 2006, the patient was  placed on a clonidine protocol because she states she was on more pain  medicines than she had told us upon admission, and she was starting to feel  like she was going in to withdrawal.   On May 13, 2006, the patient stated, I wanted to die.  She said her  daughter kicked her out and moved her significant other in.  She stated she  wanted nothing to do with her daughter.  On May 12, 2006, the patient was  placed on hourly contracts due to her self-inflicted wounds to her wrists.  On May 15, 2006, she stated she had difficulty sleeping.  She said she had  only one and a half hours of sleep for the past several nights.  I started  the p.r.n. Ambien as above.  She said the pain was helped with Flexeril and  the increased Vicodin dose.  On May 14, 2006, the patient stated she was  feeling sick.  She had a bad pain in her back and right lower abdomen.  She  wanted to be checked out by primary care Vanette Noguchi.  She was sent for an  abdominal CT which was negative.  Attempts then were made to give her laxatives to help with her constipation.  On May 14, 2006, the patient  was  still anxious and depressed.  Daughter brought her clothes, but they did not  talk.  She was still angry that her daughter had her committed.  She was on  hourly contract due to self-injurious behavior (she had taken a fork to her  neck and scratched it).  She continued to complain of pain.  She wanted her  UDS repeated, because she denied she had used any cocaine.  This was  repeated, and cocaine was in the second UDS as well.  The patient denied  that she had used this.   On May 16, 2006, the patient was on hourly contract still because of  scratching her neck deliberately.  She stated she was no longer thinking of  self-injurious behavior and wanted to be off the contracts.  They had a  family session which did not go well, according to patient.  She feels her  daughter wants to keep her here and is lying about the drugs.  Daughter  stated that her mother has been using drugs, and she found cocaine in her  home.  On May 17, 2006, the patient had finally had results from the  laxatives.  She had numerous bowel movements.  She stated she felt better,  though she still complained of pain.  She showed me a bruise on her stomach  (? self-inflicted?).  She wanted pain meds for this.  She was not given any.  She wanted to leave the next day.  On May 18, 2006, the patient's mental  status had improved.  Her mood was less depressed and anxious.  Affect wider  range.  She was friendly, cooperative, with good eye contact.  Speech:  Normal rate and flow.  Psychomotor activity was within normal limits.  There  was no suicidal or homicidal ideation or self-injurious behavior.  There was  no psychosis.  Thoughts were logical and goal-directed.  Cognitive grossly  within normal limits.  The patient is still maintaining that she does not  use drugs, even with her UDS positive for cocaine.  The patient was ready to  leave the hospital and stated she was not going to have anything to do with   her daughter.   DISCHARGE DIAGNOSES:  AXIS I:  Mood disorder, not otherwise specified.  Polysubstance abuse.  AXIS II:  Features of borderline personality disorder.  AXIS III:  Numerous healing scratches and lacerations on her neck and wrists  that were self-inflicted.  AXIS IV:  Severe (problems with primary support group, occupational problem,  housing problem, economic problem).  AXIS V:  Global Assessment of Functioning upon discharge was 43; Global  Assessment of Functioning upon admission was 30; Global Assessment of  Functioning highest past year was 65.   DISCHARGE PLANS:  There were no specific activity level or dietary  restrictions.   DISCHARGE MEDICATIONS:  1.  Temazepam 30 mg p.o. q.h.s.  2.  Diazepam 10 mg p.o. t.i.d.  3.  Risperdal 0.25 mg p.o. t.i.d. and 0.5 mg p.o. q.h.s.  4.  Ambien 10 mg, two pills at bedtime.  5.  Skelaxin 800 mg t.i.d. p.r.n.  POST-HOSPITAL CARE PLANS:  The patient will be seen at the [facility] on Monday, August 6, at 10 a.m.      Britta Mccreedy  Mendel Ryder, M.D.  Electronically Signed     BHS/MEDQ  D:  05/21/2006  T:  05/21/2006  Job:  [id number]

## 2011-03-03 NOTE — Discharge Summary (Signed)
[name], [name] NO.:  [id number]   MEDICAL RECORD NO.:  [id number]          PATIENT TYPE:  INP   LOCATION:  2027                         FACILITY:  [facility]   PHYSICIAN:  Hollice Espy, M.D.DATE OF BIRTH:  03-Jun-1956   DATE OF ADMISSION:  07/17/2005  DATE OF DISCHARGE:                                 DISCHARGE SUMMARY   DISCHARGE DIAGNOSES:  1.  Chest pain thought to be noncardiac musculoskeletal.  2.  Hypoxia likely airway reactive disease secondary to tobacco use.  3.  Tobacco use.   DISCHARGE MEDICATIONS:  1.  Albuterol MDI 2 puffs 4 times a day x1 week and then p.r.n.  2.  Percocet 5/325 one p.o. q.6h. p.r.n. total #10 only.  3.  Toradol 10 mg p.o. q.6h. p.r.n. total #10 only.   HOSPITAL COURSE:  The patient is a 55 year old white female with past  medical history of tobacco use who presented with severe chest pain located  in the mid sternal region radiating under her arm to her back. She described  the pain initially as squeezing and then more as sharp and stabbing, severe  at 8-10/10 taking her breath away. The patient was admitted. Labs were  checked. Initial set of cardiac enzymes was negative. EKG: Unremarkable. D-  dimer checked was found to be normal as well.   Dr. Corinda Gubler of Cardiology performed a treadmill exercise stress test on the  patient which showed some questionable area of downsloping in the lateral  leads during the test; however, the nuclear perfusion studies confirmed that  there was no evidence of any ischemia throughout and the test was normal.  The patient was felt to have no cardiac chest pain. She continued to have  similar episodes through her hospital stay despite normal vitals . In  addition an ABG was checked on the patient. She was found to be slightly  hypoxic with a PO2 of 74. She, herself, was not feeling short of breath but  complaining more of chest pain. In discussion with the patient we agreed  then to check  a CT scan of the chest to rule out pulmonary embolus. Despite  the negative D-dimer but with the positive hypoxia and continued chest pain  this was done on the night of July 19, 2005 which returned as negative for  pulmonary embolus. At this point I felt that the patient's chest pain was  not cardiac and might be more musculoskeletal or possibly an airway spasm  component. I gave the patient a prescription for anti-inflammatory plus pain  medication as well as albuterol inhaler to be used for one week continuously  and as needed. I have also advised the patient not to smoke. In addition to  checking the patient's risk factors during her cardiac workup she was noted  to have an elevated LDL of 154. I advised the patient that she needs to quit  smoking, eat a better diet. She tells me she will do so. She is not  interested in taking cholesterol medicine at this time.   The patient will be discharged home.  DISPOSITION:  Improved.   ACTIVITY:  As tolerated.   DISCHARGE DIET:  Regular diet.   Numbers have been given for patient to follow up and establish with a PCP.      Hollice Espy, M.D.  Electronically Signed     SKK/MEDQ  D:  07/20/2005  T:  07/20/2005  Job:  [id number]   cc:   Cecil Cranker, M.D.  [address]  [address]  [place]  [place] [address]

## 2011-03-03 NOTE — Op Note (Signed)
NAMESYMPHANI, ECKSTROM               ACCOUNT NO.:  [id number]   MEDICAL RECORD NO.:  [id number]          PATIENT TYPE:  AMB   LOCATION:  DAY                           FACILITY:  [facility]   PHYSICIAN:  Lazaro Arms, M.D.   DATE OF BIRTH:  10/05/1956   DATE OF PROCEDURE:  04/19/2005  DATE OF DISCHARGE:                                 OPERATIVE REPORT   PREOPERATIVE DIAGNOSES:  1.  Urinary retention, postoperative anterior colporrhaphy with sling with      pelvic hog graft five years ago in [place].  2.  Vaginal pain and spasm.   POSTOPERATIVE DIAGNOSES:  1.  Urinary retention, posterior anterior colporrhaphy with sling with      pelvic hog graft vies years ago in [place].  2.  Vaginal pain and spasm.   PROCEDURES:  1.  Anterior colporrhaphy with removal of pelvic hog graft.  2.  Urethrolysis.   SURGEON:  Lazaro Arms, M.D.   ANESTHESIA:  Laryngeal mask airway.   FINDINGS:  Urodynamic results are documented in the chart.  She had a large  postvoid residual.  My impression was it was from a straddling of her graft  across her bladder neck, and you could actually feel it vaginally.  That was  confirmed at the time of surgery today.  Her pelvic hog graft was  essentially removed all the way as lateral as I could go, and the whole  bladder was freed up.  In addition, her urethra was as well.  It was sent to  pathology for evaluation for completeness.  We definitely freed up from  across the bladder neck which, I think, was the cause of her urinary  retention.   DESCRIPTION OF OPERATION:  The patient was taken to the operating room and  placed in the supine position where she underwent general endotracheal  anesthesia.  She was placed in the dorsal lithotomy position, prepped and  draped in the usual sterile fashion.  A Foley catheter was placed.  The  vagina was incised at the apex of the vaginal cuff and opened.  Half percent  Marcaine was injected as a local anesthetic.  The  bladder was dissected off  the vagina without difficulty, both bluntly and sharply.  The pelvic hog  graft was then identified.  A plane between the bladder and the graft was  easily made sharply, and the graft was essentially removed as lateral as I  could go, almost all the way to the ischial spine.  Additionally, at the  bladder neck area, the graft was removed without difficulty and was, indeed,  straddling the bladder neck.  I did not appreciate that a sling had been  placed.  The area was irrigated.  The bladder neck had good mobility.  It  was tested with the Foley bulb and manually.  It was much better after the  graft  was removed.  No vagina was excised.  She had a relatively small vagina  anyway, and it was sewn with interrupted sutures.  There was good  hemostasis, and the vagina was  packed.  The patient was awakened from  anesthesia and taken to the recovery room in good and stable condition.  All  counts were correct.       LHE/MEDQ  D:  04/19/2005  T:  04/19/2005  Job:  [id number]

## 2011-03-03 NOTE — Discharge Summary (Signed)
NAMEKAMERA, DUBAS NO.:  [id number]   MEDICAL RECORD NO.:  [id number]         PATIENT TYPE:  BIPS   LOCATION:  400                           FACILITY[facility]  [facility]   PHYSICIAN:  Jasmine Pang, M.D. DATE OF BIRTH:  01/26/1956   DATE OF ADMISSION:  01/15/2007  DATE OF DISCHARGE:  02/05/2007                               DISCHARGE SUMMARY   IDENTIFYING INFORMATION:  A 55 year old divorced white female who was  admitted on an involuntary basis on January 14, 2007.   HISTORY OF PRESENT ILLNESS:  The patient was petitioned by her daughter  for threatening suicide.  She admits to sleeping with box cutters under  her pillow during the past 2 weeks.  She admits telling the daughter  that she did not care if she never woke up.  She used cocaine last week  and blew her whole paycheck on cocaine.  She has no money to pay rent.  She denies homicidal ideation.  She made superficial cuts to her left  wrist, on the dorsal surface.  She sees Dr. Betti Cruz at [facility].  Last [facility] admission was May 23, 2007 to July 26, 2007.  She has a history of anxiety, depression and poor insight  with Axis I history of polysubstance abuse.  She has a history of prior  overdose on Valium and Restoril.  She has migraine headaches and has  dental pain from a tooth that is decaying.   PHYSICAL FINDINGS:  A complete physical exam was done in the ED.  There  were no acute medical problems.   ADMISSION LABORATORY:  CBC was within normal limits.  Basic metabolic  panel was within normal limits.  TSH was 2.817, which was within normal  limits.  A hepatic function panel was within normal limits.   HOSPITAL COURSE:  Upon admission, patient was continued on Cogentin 2 mg  p.o. b.i.d., Restoril 30 mg p.o. nightly, Albuterol inhaler p.o. q.4 h.  p.r.n. shortness of breath, Risperdal 2 mg p.o. nightly.  She was also  started on Ambien 10 mg p.o. nightly p.r.n., may repeat x1 if  needed.  She was started on Vicodin 5/325 one p.o. q.6 h. p.r.n. severe pain.  She was started on Librium detox protocol since it was unclear what  other drug she may have been using.   On January 15, 2007,  patient was started on Invega 6 mg p.o. nightly.  Risperdal was discontinued.  Also, her Ambien was discontinued and  Restoril was discontinued.  She was placed on Excedrin Migraine 2  tablets with overt headache, 2 tablets q.4 h. p.r.n. headache, maximum  of 6 tablets in 24 hours.  The Invega was decreased to 3 mg p.o.  nightly.   January 16, 2007, patient's trazodone was discontinued.  She was begun on  hydroxyzine 25 mg p.o. nightly p.r.n., may repeat x1 p.r.n.  On January 16, 2007, Neurontin 300 mg now then 300 mg p.o. t.i.d. was started.  On  January 18, 2007, Invega was increased to 6 mg p.o. nightly.  On January 18, 2007, Risperdal M-Tabs 1 mg p.o. q.6 h. p.r.n. psychosis was started,  Neurontin was decreased to 100 mg p.o. t.i.d. due to concern that this  was confusing her.  On January 19, 2007, trazodone 50 mg p.o. nightly was  started with a repeat x1.  On January 20, 2007, trazodone was increased to  100 mg p.o. nightly.  On January 21, 2007, Invega was discontinued,  Risperdal was discontinued, Neurontin was discontinued.  She was started  on Abilify 15 mg p.o. nightly, Cogentin was discontinued.  On January 22, 2007, patient's Abilify was discontinued.  She was placed on Geodon 80  mg now with food, then 80 mg p.o. b.i.d. with meals.  She was started on  Haldol 5 mg p.o. or IM q.6 h. p.r.n. agitation.  She was started on  Cogentin 1 mg p.o. q.6 h. p.r.n. EPS.   On January 23, 2007, patient had a CT scan of the brain due to a rule out  of organic pathology causing acute confusion.  The results revealed no  intracranial abnormality.  Also, an RPR, B12 and folate were ordered.  The B12 was 532 (211-911).  RBC folate was slightly elevated at 773 (180-  600).  RPR was nonreactive.   On January 24, 2007, patient was started on Haldol 5 mg p.o. b.i.d. first  dose now.  On January 25, 2007, patient was started on Neurontin 100 mg  p.o. t.i.d. for anxiety.  On January 26, 2007, due to constipation, she  was started on mag citrate with water as per pharmacy protocol.  On  January 26, 1999, she was started on Cogentin 1 mg p.o. b.i.d.  On January 27, 2007, Geodon was increased at bedtime to 120 mg p.o. nightly,  continuing the 80 mg p.o. q.a.m.  On January 27, 2007, patient was given a  one-time dose of extra Neurontin 100 mg now for anxiety and pain.  On  January 28, 2007, Geodon was increased to 80 mg p.o. q.a.m. and 160 mg  p.o. nightly.  On January 29, 2007, she was started on Orajel p.r.n. for  tooth pain and naproxen 500 mg b.i.d. for tooth pain.  On January 29, 2007, the patient's Geodon was decreased to 80 mg at bedtime and  continued at 80 mg q.a.m. due to severe EPS.   Upon admission, the patient stated she did not understand why her  daughter called 911.  She did later admit she had made suicidal  comments.  She denied she had used cocaine but later admitted to smoking  crack recently.  She admitted to sleeping with a razor under her bed  because she was fearful.  During the first week of admission, patient  was upset because her Valium was stopped and she was placed on a Librium  detox protocol.  She was very sedate on the trazodone.  She felt the  Hinda Glatter may make her psychotic and wanted [place] to stop this.  She also  complained of anxiety and wanted her Valium restarted I started  Neurontin 100 mg p.o. t.i.d.  She talked about her conflict with her  daughter and upset that her daughter had committed her.  Patient  continued to feel very depressed.  Her appetite was poor.  She was  having auditory hallucinations, hearing people call her name.  She was  having visual hallucinations.  She was seeing bugs as I talk to her on January 18, 2007.  She was disoriented,  though she knew me and left the   room as we were talking.  The Invega was increased to 6 mg p.o. nightly.  Risperdal M-Tabs 1 mg p.o. q.6 h. p.r.n. psychosis or agitation was  started.  The patient continued to be confused, agitated and  disoriented, wandering into other people's rooms, at times aggressive  towards her roommate.  I had switched to Abilify 15 mg p.o. daily.  She  did not respond well to this and I changed to Geodon 80 mg p.o. b.i.d.  with meals and Haldol p.r.n. agitation.  Patient continued to have a lot  of anxiety.  She was upset that her daughter did not visit.  She felt  her daughter was trying to keep her from her grandchildren.  Daughter  appeared to have appropriate concerns about her mother's mental status.  Patient continued to have passive suicidal ideation and visual  hallucinations people coming at me.  Her thoughts remained  disorganized at times.  She was started on Haldol 5 mg p.o. b.i.d. on  January 24, 2007, in addition to the Buena.   There was no significant change in the patient's mental status as the  hospitalization progressed.  She continued to have visual  hallucinations, seeing people around her that are not there.  She had  stopped wandering into other people's rooms as frequently.  She  complained of tooth pain and was requiring Vicodin for this.  On January 27, 2007, the patient's Geodon was increased to 80 mg q.a.m. and 120 mg  nightly.  She continued to have visual hallucinations, seeing people who  were not there.  She was constipated and asked for a laxative.  I gave  her mag citrate and she had good results.  I increased her Geodon to 80  mg in the morning and 160 mg at bedtime.   On January 29, 2007, patient was complaining of tooth pain and wanting an  increase her pain meds.  I added naproxen 500 mg p.o. b.i.d. and Orajel.  On January 30, 2007, patient appeared to be improving.  I had anticipated  discharge the following day.  The Geodon and been decreased back to 80  mg  p.o. b.i.d. due to severe EPS.  On January 31, 2007, patient was upset  because she was not going home.  She had been paranoid last night.  She  thought the staff was using drugs.  She talked about hearing someone  drilling a hole in her wall to spy on her.  Mood was depressed, anxious  and angry.  Affect irritable, positive psychosis as above.  We were  working on getting the ACT team through mental health center to help her  once she leaves because she does not seem to be able to take care for  self on her own.   On February 03, 2007, patient was depressed about not going home yet.  She  still felt her daughter was trying to sabotage her discharge plans.  On  February 04, 2007, patient was supposed to be discharged; however, she  began to talk in a delirious manner.  She states she had gone home over  the weekend to her home and was unable to understand that this had not occurred.  She also had an episode of diarrhea on the floor and appeared  to be disheveled and disoriented.  Discharge was cancelled that day.   On February 05, 2007, her mental status had improved.  She was friendly,  cooperative with good eye contact.  Speech was normal rate and flow.  Her psychomotor activity was within normal limits.  Her mood was  euthymic.  Affect wide range.  No suicidal or homicidal ideation.  No  self-injurious behavior.  No auditory or visual hallucinations.  No  paranoia or delusions.  Thoughts were logical and goal-directed.  Thought content:  No predominant theme.  The cognitive was grossly back  to baseline.  It was felt patient was stable enough today to be  discharged.  The ACT team at the mental health center will start working  with her tomorrow.   DISCHARGE DIAGNOSES:  AXIS I:  Psychotic disorder, not otherwise  specified.  Also, polysubstance abuse.  AXIS II:  Features of personality disorder, not otherwise specified.  AXIS III:  Recent diarrhea.  AXIS IV:  Moderate (chronic conflict with  daughter, burden of  psychiatric illness).  AXIS V: Global assessment of functioning upon discharge was 47.  Global  assessment of functioning upon admission was 30.  Global assessment of  functioning highest past year was 68.   DISCHARGE PLANS:  There were no specific activity level or dietary  restrictions.  Patient will begin work with the [facility] team on February 06, 2007, at 10:30 a.m. (tomorrow).   DISCHARGE MEDICATIONS:  1. Trazodone 100 mg p.o. nightly.  2. Haldol 5 mg p.o. b.i.d.  3. Neurontin 100 mg p.o. t.i.d.  4. Cogentin 1 mg p.o. b.i.d.  5. Geodon 80 mg in the morning and 80 mg at bedtime.  6. Naproxen 500 mg p.o. b.i.d.  7. Norco 5/325 tablets every 6 hours if needed for pain.   Patient was to go to her dentist as soon as she is discharged, about her  tooth pain.      Jasmine Pang, M.D.  Electronically Signed     BHS/MEDQ  D:  02/05/2007  T:  02/05/2007  Job:  [id number]

## 2011-03-03 NOTE — H&P (Signed)
[facility]  Patient:    [name], [name] Visit Number: [id number] MRN: [id number]          Service Type: GYN Location: [id number] Attending Physician:  Pleas Koch Dictated by:   Georgina Peer, M.D. Admit Date:  06/21/2001                           History and Physical  ADMISSION DIAGNOSIS:          Symptomatic cystocele.  HISTORY OF PRESENT ILLNESS:   The patient is a 55 year old female, gravida 4, para 4, with a bladder repair in April of 2001, but noticed progressive bulging, pressure and pain in the vagina after lifting heavy objects after a move from [place].  She had a prominent third degree cystourethrocele with a minor rectocele and good perineal support.  She had no vaginal bleeding and no stress incontinence.  She is brought in for anterior colporrhaphy and suprapubic cystotomy.  The patient is healthy with no ongoing medical problems except for some problems with headache and arm pain that she is needing Vicodin for; she does see a specialist for that.  She has no problems with bowel movements.  She smokes one pack of cigarettes a day.  She has no heart, lung or kidney disease.  No drug use or alcoholic beverage consumption.  ALLERGIES:                    She has no known drug allergies.  MEDICATIONS:                  The patient is on no other medicines.  FAMILY HISTORY:               Family history is not significant.  REVIEW OF SYSTEMS:            Review of systems otherwise negative.  PHYSICAL EXAMINATION:  VITAL SIGNS:                  The patient has a blood pressure of 90/60. Afebrile.  HEENT:                        Normal.  NECK:                         Thyroid normal.  Neck normal.  LUNGS:                        Clear to auscultation.  HEART:                        Regular rate and rhythm.  BREASTS:                      Normal.  ABDOMEN:                      Nontender.  No organomegaly,  hepatosplenomegaly or mass.  PELVIC:                       External genitals with mild relaxation of the posterior fourchette.  No vulvar lesions.  Bartholins, urethra and Skenes normal.  There is a cystourethrocele, third degree.  Good vaginal apex support.  Pink rugous mucosa.  Cervix and  uterus absent.  No adnexal masses. Rectovaginal confirms.  IMPRESSION:                   Symptomatic cystocele.  Patient is admitted for anterior colporrhaphy and suprapubic cystotomy.  She realizes risks and complications of procedure including bladder injury, fistula formation, recurrence, continued pain and prolonged postoperative recovery or drug reaction.  She is aware of this and is willing to proceed.  We will proceed at [facility] on June 21, 2001, 9:15 a.m. Dictated by:   Georgina Peer, M.D. Attending Physician:  Pleas Koch DD:  06/21/01 TD:  06/21/01 Job: [id number]

## 2011-03-03 NOTE — Discharge Summary (Signed)
[name], VANBERGEN NO.:  [id number]   MEDICAL RECORD NO.:  [id number]          PATIENT TYPE:  IPS   LOCATION:  0402                          FACILITY:  [facility]   PHYSICIAN:  Anselm Jungling, MD  DATE OF BIRTH:  04-03-1956   DATE OF ADMISSION:  05/22/2006  DATE OF DISCHARGE:  05/25/2006                                 DISCHARGE SUMMARY   IDENTIFYING DATA AND REASON FOR ADMISSION:  The patient is a 55 year old  single white female, well-known to [place] in the psychiatric service.  She has  had many previous admissions, and was discharged a few days prior to this  readmission.  She had left our facility with the prescriptions in hand, and  funds available to support her temporarily.  According to referral  information, the patient apparently spent most of her  available cash on  cocaine immediately after discharge.  She then presented to an emergency  room homeless, without money, without any of her prescription medications,  and claimed to be suicidal.  Please refer to the admission note for further  details pertaining to the symptoms, circumstances and history that led to  her hospitalization.  She was given an initial Axis I diagnosis of  polysubstance abuse/dependence.   MEDICAL AND LABORATORY:  The patient was medically and physically assessed  by the psychiatric nurse practitioner.  She is essentially in good health  without any acute active medical problems.   HOSPITAL COURSE:  The patient was admitted to the adult inpatient service.  She presented as a thin, anxious female who was nonpsychotic, non delirious,  but was very focused on being given medication to address anxiety and pain.  She specifically requested  benzodiazepine medications and opiates.  These  were  not given due to her chemical dependency issues.  She was given low-  dose Librium on a tapering schedule as part of a detoxification protocol.   The patient denied having used cocaine during her  interval between  admissions.   The patient was felt to be appropriate for discharge on the fifth hospital  day.  At that time she had been consistently absent suicidal ideation and  showed a mental status at characterized by absence of any psychotic  features.  She did not appear to be depressed.   AFTERCARE:  The patient is discharged with a plan to follow-up at [facility] on August 13, in [place], [place].   DISCHARGE MEDICATIONS:  Clonidine 0.1 mg b.i.d., Risperdal 0.5 mg t.i.d.,  Elavil 25 mg q.h.s., and Cogentin 0.5 mg twice daily.   The patient had been started on low-dose Risperdal to address her complaints  of significant anxiety that she was having in spite of the p.r.n. clonidine  and Librium.  It was felt that addressing her anxiety with something non  addictive was worthwhile, since her risk of relapse was quite high as long  as she remains anxious.  Elavil for bedtime was given as a non addictive  sleep remedy.  The patient reported mild muscle cramps in her legs, for  which Cogentin  0.5 mg twice daily was given.  It was not certain that her  muscle cramps were related to Risperdal, versus cessation of cocaine.   DISCHARGE DIAGNOSES:  AXIS I:  Polysubstance dependence.  AXIS II: Deferred.  AXIS III: No acute or chronic illnesses.  AXIS IV: Stressors severe.  AXIS V: Global assessment of functioning on discharge 55.      Anselm Jungling, MD  Electronically Signed     SPB/MEDQ  D:  05/25/2006  T:  05/25/2006  Job:  [id number]

## 2011-03-03 NOTE — Discharge Summary (Signed)
[name], [name]               ACCOUNT NO.:  [id number]   MEDICAL RECORD NO.:  [id number]          PATIENT TYPE:  INP   LOCATION:  9318                          FACILITY:  [facility]   PHYSICIAN:  Randye Lobo, M.D.   DATE OF BIRTH:  10-15-1956   DATE OF ADMISSION:  09/15/2004  DATE OF DISCHARGE:  09/16/2004                                 DISCHARGE SUMMARY   ADMISSION DIAGNOSIS:  Second degree cystocele.   DISCHARGE DIAGNOSES:  1.  Second degree cystocele.  2.  Status post anterior colporrhaphy with PelviSoft porcine dermis graft,      cystoscopy.   PROCEDURE:  The patient underwent an anterior colporrhaphy with a PelviSoft  porcine graft and cystoscopy at [facility] on September 15, 2004, under  the direction of Randye Lobo, M.D. with the assistance of W. Lodema Hong,  M.D.   HISTORY OF PRESENT ILLNESS:  The patient is a 55 year old gravida 4, para 56-  0-0-3, Caucasian female, status post total abdominal hysterectomy with  bilateral salpingo-oophorectomy and status post anterior and posterior  colporrhaphy in 2000, who was referred by Michele Mcalpine D. Rose, M.D. for evaluation  of urinary urgency, dyspareunia, and a third degree cystocele.   PHYSICAL EXAMINATION:  Physical examination in the office documented a  normal urethra and normal external genitalia.  There was evidence of a  second degree cystocele.  The cervix and uterus were noted to be absent and  the vagina was noted to be shortened in length.  No adnexal masses were  appreciated.  The vaginal apex and the posterior vaginal wall had good  support.   HOSPITAL COURSE:  The patient underwent preoperative urodynamic testing to  rule out occult genuine stress incontinence and this testing was negative.   The patient was admitted on September 15, 2004, at which time she underwent an  anterior colporrhaphy with a PelviSoft porcine dermis graft and cystoscopy  while under general anesthesia.  Surgical findings demonstrated a  second  degree cystocele.  There was good vaginal apex and posterior vaginal wall  support.  The cervix and uterus were noted to be absent.  Cystoscopy  demonstrated patent ureters bilaterally with no evidence of a foreign body  in the bladder or the urethra.  The estimated blood loss from surgery was  less than 50 mL and there were no complications.  The patient's  postoperative course was significant for nausea and vomiting the night  following her surgery.  The patient was treated with antiemetics and IV  fluids and by postoperative day #1, the patient was able to tolerate oral  pain medications and a regular diet.   The patient began with her bladder training on postoperative day #1 and she  was able to void well.   The patient remained without any significant fevers or vaginal bleeding  during her postoperative course.   The patient is able to ambulate independently prior to discharge.   The patient's discharge hematocrit was 36.3 and she was tolerating this  well.   The patient is found to be in good condition and ready for discharge  on  postoperative day #1.   DISCHARGE INSTRUCTIONS:  1.  Discharged to home.  2.  The patient will have decreased activity for the next six weeks.  3.  The patient will follow up in the office in five weeks.  4.  The patient will call if she experiences fever, significant vaginal      bleeding, increased pain, recurrent nausea and vomiting, difficulty      voiding, or any other concerns.   DISCHARGE MEDICATIONS:  1.  The patient will take Percocet 5 mg/325 mg one to two p.o. q.4-6h p.r.n.      pain.  2.  Ibuprofen 600 mg p.o. q.6h p.r.n. pain.      BES/MEDQ  D:  11/14/2004  T:  11/14/2004  Job:  [id number]

## 2011-03-03 NOTE — Op Note (Signed)
[facility]  Patient:    [name], [name] Visit Number: [id number] MRN: [id number]          Service Type: DSU Location: [phone] Attending Physician:  Tempie Donning Dictated by:   Gita Kudo, M.D. Proc. Date: 08/09/01 Admit Date:  08/09/2001 Discharge Date: 08/10/2001                             Operative Report  PREOPERATIVE DIAGNOSIS:  Right femoral hernia.  POSTOPERATIVE DIAGNOSIS:  Right inguinal hernia, no femoral hernia.  OPERATION PERFORMED:  Explore right groin and femoral canal, repair right inguinal hernia with preperitoneal and onlay Prolene mesh.  SURGEON:  Gita Kudo, M.D.  ANESTHESIA:  General.  INDICATIONS FOR PROCEDURE:  The patient is a 55 year old female with a bulge in her right groin.  She has been told she had a groin hernia, probable femoral for several years.  About 10 years ago she had emergency repair of an incarcerated left inguinal hernia.  OPERATIVE FINDINGS:  Careful exploration of the femoral canal revealed no hernia.  She did have a inguinal hernia on the right.  Nerves were identified and not injured.  DESCRIPTION OF PROCEDURE:  Under satisfactory general endotracheal anesthesia, having received 1.0 gm Ancef preop, the patients abdomen and groin were prepped and draped in a standard fashion.  Transverse right lower quadrant incision made and carried down to and through external ring and external oblique.  Bleeders were coagulated or tied with 3-0 Vicryl.  Self-retaining retractor was placed to give excellent exposure.  Careful exam of the groin below the inguinal ligament did not reveal any palpable abnormality.  There was a definite inguinal hernia.  The distal attachment of the round ligament was divided between clamps and ties of 3-0 Vicryl.  The hernia sac was freed all around and eventually inverted.  Then the floor of the canal was opened from the pubis to the internal ring.   The inferior epigastric vessels were identified and not injured.  The femoral vessels were also identified and not injured.  Finger dissection was used to develop a preperitoneal space following which the contents were held away with a gauze.  One third of a 3 x 6 inch of Prolene mesh was then tailored into an oval to fit in the floor of the canal acting as a preperitoneal plug.  It was anchored at [facility] ligament with an interrupted 0 Prolene suture and then unfolded inferiorly and laterally.  This extended from the pubis to the femoral vessels.  The gauze was then removed and the mesh unfolded superiorly and medially.  The floor was then closed over this with running 0 Prolene suture closing the internal ring entirely and after tying, the ends were left long.  The remainder of the mesh was then tailored into an oval and attached to the abdominal wall fascia with the previously placed suture.  This held it in good position and it was tacked around the periphery with interrupted 0 Prolene to the internal oblique muscle above and the inguinal ligament below.  The wound was then lavaged with saline, infiltrated with Marcaine, closed in layers with running 2-0 Vicryl for external oblique, 2-0 Vicryl for deep fascia, 3-0 Vicryl for subcutaneous, Steri-Strips for skin.  There were no complication.  The sponge and needle counts were correct. Dictated by:   Gita Kudo, M.D. Attending Physician:  Tempie Donning DD:  08/09/01 TD:  08/12/01 Job: [id number]

## 2011-03-03 NOTE — H&P (Signed)
[name], [name]               ACCOUNT NO.:  [id number]   MEDICAL RECORD NO.:  [id number]          PATIENT TYPE:  AMB   LOCATION:  DAY                           FACILITY:  [facility]   PHYSICIAN:  Bernerd Limbo. Destefano, M.D.DATE OF BIRTH:  04-02-1956   DATE OF ADMISSION:  DATE OF DISCHARGE:  LH                                HISTORY & PHYSICAL   This 55 year old white female is admitted to this hospital with recurrent  umbilical hernia.   HISTORY OF PRESENT ILLNESS:  She had the umbilical hernia performed many  years ago in [place].  In the last 2-3 days apparently she has been  doing some heavy lifting and experienced a lot of pain at the incision in  the umbilicus.  She was seen by Dr. Wende Crease, and the patient was referred to  me.  There has been no nausea or vomiting.  Appetite has been good.  Normal  bowel movements.  No obstructive symptoms.   PAST MEDICAL HISTORY:  She has had a previous umbilical hernia repair as  mentioned in 2000.  She had a hysterectomy in 1982.  She is a gravida 4,  para 4, AB 0.   PHYSICAL EXAMINATION:  GENERAL APPEARANCE:  Reveals a thin, healthy, well-  nourished, 55 year old white female in no acute distress.  HEENT:  Head, eyes, ears, nose and throat are normal.  NECK:  Supple.  Thyroid is not enlarged.  No palpable adenopathy.  CARDIOVASCULAR:  Regular sinus rhythm with no thrills or murmurs.  RESPIRATORY:  Chest clear to percussion and auscultation.  CHEST:  Breasts are similar in size.  Nipples symmetrical.  No abnormal  masses.  Examination of both axilla was normal.  ABDOMEN:  Flat and soft.  There is a well-healed laparoscopy scar in the  right rectus lateral to the umbilicus.  There is no bulge noted, but on  palpating the area deeply there is extreme pain elicited.  There is normal  peristalsis.  No masses are noted.  MUSCULOSKELETAL:  Limbs and back negative.  RECTAL:  Examination was normal.  PELVIC:  Deferred.   ADMISSION DIAGNOSIS:   Recurrent ventral hernia.   DISPOSITION:  She is admitted for repair of a recurrent ventral hernia.  The  surgery, risks and complications and possible comsequences have been  discussed with the patient.  She agrees with the surgery.  She has been  scheduled for Monday the 3rd of April.      NMD/MEDQ  D:  01/13/2005  T:  01/13/2005  Job:  [id number]

## 2011-03-14 ENCOUNTER — Ambulatory Visit (HOSPITAL_COMMUNITY)
Admission: RE | Admit: 2011-03-14 | Discharge: 2011-03-14 | Disposition: A | Discharge status: Home / Self Care | Payer: Medicare Other | Source: Ambulatory Visit | Attending: Family Medicine | Admitting: Family Medicine

## 2011-03-14 DIAGNOSIS — Z1231 Encounter for screening mammogram for malignant neoplasm of breast: Secondary | ICD-10-CM | POA: Insufficient documentation

## 2011-03-14 DIAGNOSIS — Z139 Encounter for screening, unspecified: Secondary | ICD-10-CM

## 2011-07-07 LAB — URINALYSIS, ROUTINE W REFLEX MICROSCOPIC
Bilirubin Urine: NEGATIVE
Glucose, UA: NEGATIVE
Hgb urine dipstick: NEGATIVE
Ketones, ur: NEGATIVE
Ketones, ur: NEGATIVE
Nitrite: NEGATIVE
Protein, ur: NEGATIVE
Urobilinogen, UA: 0.2
Urobilinogen, UA: 0.2
pH: 5.5

## 2011-07-07 LAB — HEPATIC FUNCTION PANEL
Albumin: 3.8
Total Protein: 6.6

## 2011-07-07 LAB — ETHANOL: Alcohol, Ethyl (B): <5

## 2011-07-07 LAB — CBC
HCT: 42.3
MCHC: 34.5
MCV: 91.5
Platelets: 309
RDW: 12.6

## 2011-07-07 LAB — BASIC METABOLIC PANEL
BUN: 11
CO2: 28
Chloride: 102
Creatinine, Ser: 0.89
Glucose, Bld: 88

## 2011-07-07 LAB — RAPID URINE DRUG SCREEN, HOSP PERFORMED
Amphetamines: NOT DETECTED
Cocaine: POSITIVE — AB
Tetrahydrocannabinol: NOT DETECTED

## 2011-07-07 LAB — DIFFERENTIAL
Basophils Relative: 1
Eosinophils Absolute: 0.3
Eosinophils Relative: 5
Neutrophils Relative %: 57

## 2011-07-07 LAB — URINE CULTURE: Culture: NO GROWTH

## 2011-07-10 LAB — COMPREHENSIVE METABOLIC PANEL
ALT: 16
AST: 15
Albumin: 3 — ABNORMAL LOW
BUN: 2 — ABNORMAL LOW
Calcium: 8.1 — ABNORMAL LOW
GFR calc non Af Amer: >60
Glucose, Bld: 88
Potassium: 3.4 — ABNORMAL LOW

## 2011-07-10 LAB — DIFFERENTIAL
Basophils Absolute: 0.1
Basophils Relative: 1
Eosinophils Absolute: 0.4
Eosinophils Absolute: 0.5
Eosinophils Relative: 6 — ABNORMAL HIGH
Lymphocytes Relative: 25
Lymphs Abs: 1.6
Monocytes Absolute: 0.4
Monocytes Relative: 7
Neutrophils Relative %: 54

## 2011-07-10 LAB — BASIC METABOLIC PANEL
BUN: 3 — ABNORMAL LOW
BUN: 3 — ABNORMAL LOW
CO2: 27
Chloride: 108
Chloride: 108
Creatinine, Ser: 0.85
GFR calc non Af Amer: >60
Glucose, Bld: 106 — ABNORMAL HIGH
Glucose, Bld: 90
Potassium: 3.6
Sodium: 140

## 2011-07-10 LAB — CBC
HCT: 43.6
Hemoglobin: 15.2 — ABNORMAL HIGH
MCHC: 34.9
MCV: 92.6
MCV: 91.7
Platelets: 453 — ABNORMAL HIGH
RDW: 13.1
RDW: 13.3

## 2011-07-10 LAB — RAPID URINE DRUG SCREEN, HOSP PERFORMED
Amphetamines: NOT DETECTED
Barbiturates: NOT DETECTED
Barbiturates: NOT DETECTED
Benzodiazepines: POSITIVE — AB
Benzodiazepines: POSITIVE — AB
Cocaine: POSITIVE — AB
Opiates: NOT DETECTED
Opiates: NOT DETECTED

## 2011-07-10 LAB — APTT: aPTT: 29

## 2011-07-10 LAB — ACETAMINOPHEN LEVEL: Acetaminophen (Tylenol), Serum: <10 — ABNORMAL LOW

## 2011-07-10 LAB — URINALYSIS, ROUTINE W REFLEX MICROSCOPIC
Bilirubin Urine: NEGATIVE
Glucose, UA: NEGATIVE
Hgb urine dipstick: NEGATIVE
Specific Gravity, Urine: 1.01

## 2011-07-10 LAB — SALICYLATE LEVEL
Salicylate Lvl: 17.8
Salicylate Lvl: 34.9
Salicylate Lvl: 37

## 2012-02-26 ENCOUNTER — Other Ambulatory Visit (HOSPITAL_COMMUNITY): Payer: Self-pay | Admitting: Family Medicine

## 2012-02-26 DIAGNOSIS — Z139 Encounter for screening, unspecified: Secondary | ICD-10-CM

## 2012-03-15 ENCOUNTER — Ambulatory Visit (HOSPITAL_COMMUNITY): Payer: Medicare Other

## 2012-03-19 ENCOUNTER — Ambulatory Visit (HOSPITAL_COMMUNITY): Admission: RE | Admit: 2012-03-19 | Payer: Medicare Other | Source: Ambulatory Visit

## 2013-03-11 ENCOUNTER — Other Ambulatory Visit (HOSPITAL_COMMUNITY): Payer: Self-pay | Admitting: Family Medicine

## 2013-03-11 DIAGNOSIS — Z139 Encounter for screening, unspecified: Secondary | ICD-10-CM

## 2013-03-24 ENCOUNTER — Other Ambulatory Visit (HOSPITAL_COMMUNITY): Payer: Self-pay | Admitting: Family Medicine

## 2013-03-24 ENCOUNTER — Ambulatory Visit (HOSPITAL_COMMUNITY): Payer: Medicare Other

## 2013-03-24 ENCOUNTER — Ambulatory Visit (HOSPITAL_COMMUNITY)
Admission: RE | Admit: 2013-03-24 | Discharge: 2013-03-24 | Disposition: A | Discharge status: Home / Self Care | Payer: Medicare Other | Source: Ambulatory Visit | Attending: Family Medicine | Admitting: Family Medicine

## 2013-03-24 DIAGNOSIS — IMO0002 Reserved for concepts with insufficient information to code with codable children: Secondary | ICD-10-CM | POA: Insufficient documentation

## 2013-03-24 DIAGNOSIS — J4489 Other specified chronic obstructive pulmonary disease: Secondary | ICD-10-CM | POA: Insufficient documentation

## 2013-03-24 DIAGNOSIS — F17200 Nicotine dependence, unspecified, uncomplicated: Secondary | ICD-10-CM

## 2013-03-24 DIAGNOSIS — J449 Chronic obstructive pulmonary disease, unspecified: Secondary | ICD-10-CM

## 2013-03-24 DIAGNOSIS — W108XXA Fall (on) (from) other stairs and steps, initial encounter: Secondary | ICD-10-CM | POA: Insufficient documentation

## 2013-03-24 DIAGNOSIS — M549 Dorsalgia, unspecified: Secondary | ICD-10-CM

## 2013-03-24 DIAGNOSIS — Z139 Encounter for screening, unspecified: Secondary | ICD-10-CM

## 2013-03-24 DIAGNOSIS — M545 Low back pain, unspecified: Secondary | ICD-10-CM | POA: Insufficient documentation

## 2013-03-24 DIAGNOSIS — Z1231 Encounter for screening mammogram for malignant neoplasm of breast: Secondary | ICD-10-CM | POA: Insufficient documentation

## 2013-03-24 DIAGNOSIS — Y9289 Other specified places as the place of occurrence of the external cause: Secondary | ICD-10-CM | POA: Insufficient documentation

## 2013-04-23 ENCOUNTER — Other Ambulatory Visit (HOSPITAL_COMMUNITY): Payer: Self-pay | Admitting: Family Medicine

## 2013-04-23 DIAGNOSIS — M545 Low back pain: Secondary | ICD-10-CM

## 2013-04-29 ENCOUNTER — Ambulatory Visit (HOSPITAL_COMMUNITY): Payer: Medicare Other

## 2013-07-02 ENCOUNTER — Encounter (INDEPENDENT_AMBULATORY_CARE_PROVIDER_SITE_OTHER): Payer: Self-pay | Admitting: *Deleted

## 2013-08-19 ENCOUNTER — Ambulatory Visit (INDEPENDENT_AMBULATORY_CARE_PROVIDER_SITE_OTHER): Payer: Medicare Other | Admitting: Internal Medicine

## 2013-10-20 ENCOUNTER — Ambulatory Visit (INDEPENDENT_AMBULATORY_CARE_PROVIDER_SITE_OTHER): Payer: Medicare Other | Admitting: Internal Medicine

## 2013-11-14 ENCOUNTER — Ambulatory Visit (HOSPITAL_COMMUNITY)
Admission: RE | Admit: 2013-11-14 | Discharge: 2013-11-14 | Disposition: A | Discharge status: Home / Self Care | Payer: Medicare Other | Source: Ambulatory Visit | Attending: Family Medicine | Admitting: Family Medicine

## 2013-11-14 ENCOUNTER — Other Ambulatory Visit (HOSPITAL_COMMUNITY): Payer: Self-pay | Admitting: Family Medicine

## 2013-11-14 DIAGNOSIS — M25522 Pain in left elbow: Secondary | ICD-10-CM

## 2013-12-10 ENCOUNTER — Ambulatory Visit (HOSPITAL_COMMUNITY): Admission: RE | Admit: 2013-12-10 | Payer: Medicare HMO | Source: Ambulatory Visit

## 2013-12-10 ENCOUNTER — Ambulatory Visit (HOSPITAL_COMMUNITY)
Admission: RE | Admit: 2013-12-10 | Discharge: 2013-12-10 | Disposition: A | Discharge status: Home / Self Care | Payer: Medicare HMO | Source: Ambulatory Visit | Attending: Family Medicine | Admitting: Family Medicine

## 2013-12-10 ENCOUNTER — Other Ambulatory Visit (HOSPITAL_COMMUNITY): Payer: Self-pay | Admitting: Family Medicine

## 2013-12-10 DIAGNOSIS — R05 Cough: Secondary | ICD-10-CM

## 2013-12-10 DIAGNOSIS — R058 Other specified cough: Secondary | ICD-10-CM

## 2013-12-10 DIAGNOSIS — R059 Cough, unspecified: Secondary | ICD-10-CM | POA: Insufficient documentation

## 2014-05-29 ENCOUNTER — Emergency Department (HOSPITAL_COMMUNITY): Payer: Medicare HMO

## 2014-05-29 ENCOUNTER — Encounter (HOSPITAL_COMMUNITY): Payer: Self-pay | Admitting: Emergency Medicine

## 2014-05-29 ENCOUNTER — Emergency Department (HOSPITAL_COMMUNITY)
Admission: EM | Admit: 2014-05-29 | Discharge: 2014-05-29 | Disposition: A | Discharge status: Home / Self Care | Payer: Medicare HMO | Attending: Emergency Medicine | Admitting: Emergency Medicine

## 2014-05-29 DIAGNOSIS — R071 Chest pain on breathing: Secondary | ICD-10-CM | POA: Diagnosis not present

## 2014-05-29 DIAGNOSIS — R0602 Shortness of breath: Secondary | ICD-10-CM | POA: Insufficient documentation

## 2014-05-29 DIAGNOSIS — Z880 Allergy status to penicillin: Secondary | ICD-10-CM | POA: Insufficient documentation

## 2014-05-29 DIAGNOSIS — IMO0002 Reserved for concepts with insufficient information to code with codable children: Secondary | ICD-10-CM | POA: Diagnosis not present

## 2014-05-29 DIAGNOSIS — F17200 Nicotine dependence, unspecified, uncomplicated: Secondary | ICD-10-CM | POA: Insufficient documentation

## 2014-05-29 DIAGNOSIS — J441 Chronic obstructive pulmonary disease with (acute) exacerbation: Secondary | ICD-10-CM | POA: Diagnosis not present

## 2014-05-29 DIAGNOSIS — G8929 Other chronic pain: Secondary | ICD-10-CM | POA: Diagnosis not present

## 2014-05-29 DIAGNOSIS — R0789 Other chest pain: Secondary | ICD-10-CM

## 2014-05-29 HISTORY — DX: Chronic obstructive pulmonary disease, unspecified: J44.9

## 2014-05-29 HISTORY — DX: Other chronic pain: G89.29

## 2014-05-29 HISTORY — DX: Dorsalgia, unspecified: M54.9

## 2014-05-29 LAB — CBC WITH DIFFERENTIAL/PLATELET
Basophils Absolute: 0.1 10*3/uL (ref 0.0–0.1)
Basophils Relative: 1 % (ref 0–1)
EOS ABS: 0.1 10*3/uL (ref 0.0–0.7)
EOS PCT: 1 % (ref 0–5)
HEMATOCRIT: 46.7 % — AB (ref 36.0–46.0)
Hemoglobin: 15.8 g/dL — ABNORMAL HIGH (ref 12.0–15.0)
LYMPHS ABS: 3.2 10*3/uL (ref 0.7–4.0)
Lymphocytes Relative: 35 % (ref 12–46)
MCH: 30.4 pg (ref 26.0–34.0)
MCHC: 33.8 g/dL (ref 30.0–36.0)
MCV: 90 fL (ref 78.0–100.0)
MONO ABS: 0.9 10*3/uL (ref 0.1–1.0)
MONOS PCT: 9 % (ref 3–12)
Neutro Abs: 5 10*3/uL (ref 1.7–7.7)
Neutrophils Relative %: 54 % (ref 43–77)
PLATELETS: 248 10*3/uL (ref 150–400)
RBC: 5.19 MIL/uL — AB (ref 3.87–5.11)
RDW: 14.2 % (ref 11.5–15.5)
WBC: 9.3 10*3/uL (ref 4.0–10.5)

## 2014-05-29 LAB — COMPREHENSIVE METABOLIC PANEL
ALT: 22 U/L (ref 0–35)
ANION GAP: 11 (ref 5–15)
AST: 20 U/L (ref 0–37)
Albumin: 4 g/dL (ref 3.5–5.2)
Alkaline Phosphatase: 111 U/L (ref 39–117)
BUN: 14 mg/dL (ref 6–23)
CALCIUM: 9.6 mg/dL (ref 8.4–10.5)
CO2: 31 mEq/L (ref 19–32)
CREATININE: 0.8 mg/dL (ref 0.50–1.10)
Chloride: 100 mEq/L (ref 96–112)
GFR, EST NON AFRICAN AMERICAN: 80 mL/min — AB (ref >90)
GLUCOSE: 81 mg/dL (ref 70–99)
Potassium: 4 mEq/L (ref 3.7–5.3)
SODIUM: 142 meq/L (ref 137–147)
TOTAL PROTEIN: 7.4 g/dL (ref 6.0–8.3)
Total Bilirubin: 0.2 mg/dL — ABNORMAL LOW (ref 0.3–1.2)

## 2014-05-29 LAB — PRO B NATRIURETIC PEPTIDE: PRO B NATRI PEPTIDE: 78.7 pg/mL (ref 0–125)

## 2014-05-29 LAB — TROPONIN I

## 2014-05-29 MED ORDER — SODIUM CHLORIDE 0.9 % IV SOLN
Freq: Once | INTRAVENOUS | Status: AC
  Administered 2014-05-29: 18:00:00 via INTRAVENOUS

## 2014-05-29 MED ORDER — DIAZEPAM 5 MG/ML IJ SOLN
5.0000 mg | Freq: Once | INTRAMUSCULAR | Status: AC
  Administered 2014-05-29: 5 mg via INTRAVENOUS
  Filled 2014-05-29: qty 2

## 2014-05-29 MED ORDER — OXYCODONE-ACETAMINOPHEN 5-325 MG PO TABS: 2.0000 | ORAL_TABLET | ORAL | Status: DC | PRN

## 2014-05-29 MED ORDER — HYDROMORPHONE HCL PF 1 MG/ML IJ SOLN
1.0000 mg | Freq: Once | INTRAMUSCULAR | Status: AC
  Administered 2014-05-29: 1 mg via INTRAVENOUS
  Filled 2014-05-29: qty 1

## 2014-05-29 MED ORDER — ALBUTEROL (5 MG/ML) CONTINUOUS INHALATION SOLN
10.0000 mg/h | INHALATION_SOLUTION | Freq: Once | RESPIRATORY_TRACT | Status: AC
  Administered 2014-05-29: 10 mg/h via RESPIRATORY_TRACT
  Filled 2014-05-29: qty 20

## 2014-05-29 MED ORDER — IOHEXOL 350 MG/ML SOLN
100.0000 mL | Freq: Once | INTRAVENOUS | Status: AC | PRN
  Administered 2014-05-29: 100 mL via INTRAVENOUS

## 2014-05-29 MED ORDER — ONDANSETRON HCL 4 MG/2ML IJ SOLN
4.0000 mg | Freq: Once | INTRAMUSCULAR | Status: AC
  Administered 2014-05-29: 4 mg via INTRAVENOUS
  Filled 2014-05-29: qty 2

## 2014-05-29 NOTE — ED Notes (Signed)
Fentanyl patch 100mcg removed and wasted by nurse and witness Lelon MastSamantha, RN verbal order by ER MD.

## 2014-05-29 NOTE — ED Notes (Signed)
PT stated she went inside her house and developed extreme shortness of breath and sharp pain to right shoulder blade.

## 2014-05-29 NOTE — ED Provider Notes (Signed)
CSN: [id number]     Arrival date & time 05/29/14  1659 History   First MD Initiated Contact with Patient 05/29/14 1703     Chief Complaint  Patient presents with  . Shortness of Breath     (Consider location/radiation/quality/duration/timing/severity/associated sxs/prior Treatment) HPI Comments: She presents to the ER for evaluation of shortness of breath. Patient reports that she had fairly sudden onset of sharp pain in the right side of her chest and severe shortness of breath just prior to arrival. She does have a history of COPD. She used her albuterol inhaler but it does not help the pain or shortness of breath. She reports that she feels like she cannot get a deep breath. No nausea, vomiting, fever, diaphoresis.  Patient is a 58 y.o. female presenting with shortness of breath.  Shortness of Breath Associated symptoms: chest pain     Past Medical History  Diagnosis Date  . COPD (chronic obstructive pulmonary disease)   . Chronic back pain    Past Surgical History  Procedure Laterality Date  . Hernia repair    . Abdominal hysterectomy    . Bladder repair     No family history on file. History  Substance Use Topics  . Smoking status: Current Every Day Smoker -- 0.50 packs/day    Types: Cigarettes  . Smokeless tobacco: Not on file  . Alcohol Use: No   OB History   Grav Para Term Preterm Abortions TAB SAB Ect Mult Living                 Review of Systems  Respiratory: Positive for shortness of breath.   Cardiovascular: Positive for chest pain.  All other systems reviewed and are negative.     Allergies  Amoxicillin  Home Medications   Prior to Admission medications   Medication Sig Start Date End Date Taking? Authorizing Provider  budesonide-formoterol (SYMBICORT) 160-4.5 MCG/ACT inhaler Inhale 2 puffs into the lungs every 4 (four) hours as needed (for shortness of breath).   Yes Historical Provider, MD  fentaNYL (DURAGESIC - DOSED MCG/HR) 100 MCG/HR Place  100 mcg onto the skin every 3 (three) days.   Yes Historical Provider, MD  methylPREDNISolone (MEDROL DOSEPAK) 4 MG tablet Take by mouth. follow package directions 7/29    Historical Provider, MD  oxyCODONE-acetaminophen (PERCOCET) 5-325 MG per tablet Take 2 tablets by mouth every 4 (four) hours as needed. 05/29/14   Gilda Creasehristopher J. Elorah Dewing, MD   BP 104/85  Pulse 103  Temp(Src) 97.7 F (36.5 C) (Oral)  Resp 17  Ht 5' (1.524 m)  Wt 120 lb (54.432 kg)  BMI 23.44 kg/m2  SpO2 92% Physical Exam  Constitutional: She is oriented to person, place, and time. She appears well-developed and well-nourished. She appears distressed.  HENT:  Head: Normocephalic and atraumatic.  Right Ear: Hearing normal.  Left Ear: Hearing normal.  Nose: Nose normal.  Mouth/Throat: Oropharynx is clear and moist and mucous membranes are normal.  Eyes: Conjunctivae and EOM are normal. Pupils are equal, round, and reactive to light.  Neck: Normal range of motion. Neck supple.  Cardiovascular: Regular rhythm, S1 normal and S2 normal.  Exam reveals no gallop and no friction rub.   No murmur heard. Pulmonary/Chest: Effort normal. No respiratory distress. She has decreased breath sounds (Splinting). She has wheezes in the right lower field and the left lower field. She exhibits no tenderness.  Abdominal: Soft. Normal appearance and bowel sounds are normal. There is no hepatosplenomegaly. There is no  tenderness. There is no rebound, no guarding, no tenderness at McBurney's point and negative Murphy's sign. No hernia.  Musculoskeletal: Normal range of motion.  Neurological: She is alert and oriented to person, place, and time. She has normal strength. No cranial nerve deficit or sensory deficit. Coordination normal. GCS eye subscore is 4. GCS verbal subscore is 5. GCS motor subscore is 6.  Skin: Skin is warm, dry and intact. No rash noted. No cyanosis.  Psychiatric: She has a normal mood and affect. Her speech is normal and  behavior is normal. Thought content normal.    ED Course  Procedures (including critical care time) Labs Review Labs Reviewed  CBC WITH DIFFERENTIAL - Abnormal; Notable for the following:    RBC 5.19 (*)    Hemoglobin 15.8 (*)    HCT 46.7 (*)    All other components within normal limits  COMPREHENSIVE METABOLIC PANEL - Abnormal; Notable for the following:    Total Bilirubin 0.2 (*)    GFR calc non Af Amer 80 (*)    All other components within normal limits  TROPONIN I  PRO B NATRIURETIC PEPTIDE    Imaging Review Ct Angio Chest Pe W/cm &/or Wo Cm  05/29/2014   CLINICAL DATA:  Shortness of breath.  Pain about the right shoulder.  EXAM: CT ANGIOGRAPHY CHEST WITH CONTRAST  TECHNIQUE: Multidetector CT imaging of the chest was performed using the standard protocol during bolus administration of intravenous contrast. Multiplanar CT image reconstructions and MIPs were obtained to evaluate the vascular anatomy.  CONTRAST:  100 mL OMNIPAQUE IOHEXOL 350 MG/ML SOLN  COMPARISON:  CT chest 07/19/2005. Single view of the chest earlier this same day.  FINDINGS: No pulmonary embolus is identified. Moderate hiatal hernia is present. There is a small right pleural effusion. No left pleural effusion or pericardial effusion. Heart size is normal. No calcific coronary artery disease is identified. There is no axillary, hilar or mediastinal lymphadenopathy. The lungs demonstrate only mild dependent atelectasis. Visualized upper abdomen is unremarkable. No focal bony abnormality is identified.  Review of the MIP images confirms the above findings.  IMPRESSION: Negative for pulmonary embolus.  Very small right pleural effusion.  Moderate hiatal hernia.   Electronically Signed   By: Drusilla Kanner M.D.   On: 05/29/2014 19:58   Dg Chest Port 1 View  05/29/2014   CLINICAL DATA:  Shortness of breath. Acute onset right side chest pain today.  EXAM: PORTABLE CHEST - 1 VIEW  COMPARISON:  PA and lateral chest 12/10/2013.   FINDINGS: Lung volumes are low with some crowding of the bronchovascular structures. The lungs are clear. There is no pneumothorax or pleural effusion. Heart size is normal.  IMPRESSION: No acute disease.   Electronically Signed   By: Drusilla Kanner M.D.   On: 05/29/2014 17:35     EKG Interpretation   Date/Time:  Friday May 29 2014 17:10:11 EDT Ventricular Rate:  89 PR Interval:  151 QRS Duration: 80 QT Interval:  356 QTC Calculation: 433 R Axis:   24 Text Interpretation:  Sinus rhythm Normal ECG Confirmed by Maraki Macquarrie  MD,  Krithika Tome [phone]) on 05/29/2014 5:14:23 PM      MDM   Final diagnoses:  Chest wall pain    Patient presented to the ER for evaluation of right-sided chest pain. Patient had sudden onset of severe, sharp stabbing pain in the right side of her chest. She was feeling short of breath. Patient was having intermittent spasms of the right side of  her chest wall. She had small movements returned to the practice he obviously had approximately pain associated with it. There was no concern for acute coronary syndrome, cardiac evaluation was negative. Patient underwent CT angiography to further evaluate for possible PE. No acute pathology was seen. Patient treated with analgesia and Valium here in the ER for right-sided chest wall pain with intercostal muscle spasm. She was also treated with purulence she does have a history of COPD, but no significant spasm.   Gilda Crease, MD 05/29/14 2127

## 2014-05-29 NOTE — Discharge Instructions (Signed)

## 2014-06-05 MED FILL — Oxycodone w/ Acetaminophen Tab 5-325 MG: ORAL | Qty: 6 | Status: AC

## 2014-07-14 ENCOUNTER — Other Ambulatory Visit (HOSPITAL_COMMUNITY): Payer: Self-pay | Admitting: Family Medicine

## 2014-07-14 DIAGNOSIS — R609 Edema, unspecified: Secondary | ICD-10-CM

## 2014-07-21 ENCOUNTER — Ambulatory Visit (HOSPITAL_COMMUNITY)
Admission: RE | Admit: 2014-07-21 | Discharge: 2014-07-21 | Disposition: A | Discharge status: Home / Self Care | Payer: Medicare HMO | Source: Ambulatory Visit | Attending: Family Medicine | Admitting: Family Medicine

## 2014-07-21 DIAGNOSIS — N63 Unspecified lump in breast: Secondary | ICD-10-CM | POA: Insufficient documentation

## 2014-07-21 DIAGNOSIS — R609 Edema, unspecified: Secondary | ICD-10-CM

## 2015-06-23 ENCOUNTER — Ambulatory Visit (HOSPITAL_COMMUNITY): Payer: Medicare HMO

## 2015-07-16 ENCOUNTER — Other Ambulatory Visit (HOSPITAL_COMMUNITY): Payer: Self-pay | Admitting: Family Medicine

## 2015-07-16 DIAGNOSIS — Z1231 Encounter for screening mammogram for malignant neoplasm of breast: Secondary | ICD-10-CM

## 2015-07-23 ENCOUNTER — Ambulatory Visit (HOSPITAL_COMMUNITY): Payer: Medicare HMO

## 2015-08-06 ENCOUNTER — Other Ambulatory Visit (HOSPITAL_COMMUNITY): Payer: Self-pay | Admitting: Family Medicine

## 2015-08-06 ENCOUNTER — Ambulatory Visit (HOSPITAL_COMMUNITY)
Admission: RE | Admit: 2015-08-06 | Discharge: 2015-08-06 | Disposition: A | Discharge status: Home / Self Care | Payer: Medicare HMO | Source: Ambulatory Visit | Attending: Family Medicine | Admitting: Family Medicine

## 2015-08-06 DIAGNOSIS — M79674 Pain in right toe(s): Secondary | ICD-10-CM | POA: Insufficient documentation

## 2015-08-06 DIAGNOSIS — Z1231 Encounter for screening mammogram for malignant neoplasm of breast: Secondary | ICD-10-CM | POA: Insufficient documentation

## 2015-08-06 DIAGNOSIS — IMO0002 Reserved for concepts with insufficient information to code with codable children: Secondary | ICD-10-CM

## 2015-08-06 DIAGNOSIS — R229 Localized swelling, mass and lump, unspecified: Principal | ICD-10-CM

## 2015-11-10 ENCOUNTER — Ambulatory Visit (HOSPITAL_COMMUNITY)
Admission: RE | Admit: 2015-11-10 | Discharge: 2015-11-10 | Disposition: A | Discharge status: Home / Self Care | Payer: Commercial Managed Care - HMO | Source: Ambulatory Visit | Attending: Family Medicine | Admitting: Family Medicine

## 2015-11-10 ENCOUNTER — Other Ambulatory Visit (HOSPITAL_COMMUNITY): Payer: Self-pay | Admitting: Family Medicine

## 2015-11-10 DIAGNOSIS — G8929 Other chronic pain: Secondary | ICD-10-CM | POA: Insufficient documentation

## 2015-11-10 DIAGNOSIS — M545 Low back pain: Secondary | ICD-10-CM

## 2015-11-11 ENCOUNTER — Encounter (INDEPENDENT_AMBULATORY_CARE_PROVIDER_SITE_OTHER): Payer: Self-pay | Admitting: *Deleted

## 2015-11-29 ENCOUNTER — Encounter (INDEPENDENT_AMBULATORY_CARE_PROVIDER_SITE_OTHER): Payer: Self-pay | Admitting: *Deleted

## 2015-11-30 ENCOUNTER — Other Ambulatory Visit (INDEPENDENT_AMBULATORY_CARE_PROVIDER_SITE_OTHER): Payer: Self-pay | Admitting: *Deleted

## 2015-11-30 DIAGNOSIS — Z800 Family history of malignant neoplasm of digestive organs: Secondary | ICD-10-CM

## 2015-11-30 DIAGNOSIS — Z1211 Encounter for screening for malignant neoplasm of colon: Secondary | ICD-10-CM

## 2016-01-14 ENCOUNTER — Other Ambulatory Visit (INDEPENDENT_AMBULATORY_CARE_PROVIDER_SITE_OTHER): Payer: Self-pay | Admitting: *Deleted

## 2016-01-14 ENCOUNTER — Encounter (INDEPENDENT_AMBULATORY_CARE_PROVIDER_SITE_OTHER): Payer: Self-pay | Admitting: *Deleted

## 2016-01-14 NOTE — Telephone Encounter (Signed)
trilyte 

## 2016-01-17 MED ORDER — PEG 3350-KCL-NA BICARB-NACL 420 G PO SOLR: 4000.0000 mL | Freq: Once | ORAL | Status: DC

## 2016-01-19 ENCOUNTER — Telehealth (INDEPENDENT_AMBULATORY_CARE_PROVIDER_SITE_OTHER): Payer: Self-pay | Admitting: *Deleted

## 2016-01-19 NOTE — Telephone Encounter (Signed)
Referring MD/PCP: knowlton   Procedure: tcs  Reason/Indication:  Screening, fam hx colon ca  Has patient had this procedure before?  Yes, 2006  If so, when, by whom and where?    Is there a family history of colon cancer?  Yes, mother, maternal grandmother, maternal uncle  Who?  What age when diagnosed?    Is patient diabetic?   no      Does patient have prosthetic heart valve or mechanical valve?  no  Do you have a pacemaker?  no  Has patient ever had endocarditis? no  Has patient had joint replacement within last 12 months?  no  Does patient tend to be constipated or take laxatives? yes  Does patient have a history of alcohol/drug use?  nno  Is patient on Coumadin, Plavix and/or Aspirin? no   No propofol per Dr Karilyn Cotaehman Medications: fentyl patch 50 mcg change very 3 days, phenergan 25 mg 1/4 tab prn, symbicort and albuterol   Allergies: see epic  Medication Adjustment:   Procedure date & time: 02/17/16 at 830

## 2016-01-19 NOTE — Telephone Encounter (Signed)
agree

## 2016-02-17 ENCOUNTER — Encounter (HOSPITAL_COMMUNITY): Payer: Self-pay | Admitting: *Deleted

## 2016-02-17 ENCOUNTER — Ambulatory Visit (HOSPITAL_COMMUNITY)
Admission: RE | Admit: 2016-02-17 | Discharge: 2016-02-17 | Disposition: A | Discharge status: Home / Self Care | Payer: Medicare HMO | Source: Ambulatory Visit | Attending: Internal Medicine | Admitting: Internal Medicine

## 2016-02-17 ENCOUNTER — Encounter (HOSPITAL_COMMUNITY)
Admission: RE | Disposition: A | Discharge status: Home / Self Care | Payer: Self-pay | Source: Ambulatory Visit | Attending: Internal Medicine

## 2016-02-17 DIAGNOSIS — K5730 Diverticulosis of large intestine without perforation or abscess without bleeding: Secondary | ICD-10-CM | POA: Diagnosis not present

## 2016-02-17 DIAGNOSIS — Z86010 Personal history of colonic polyps: Secondary | ICD-10-CM | POA: Insufficient documentation

## 2016-02-17 DIAGNOSIS — J45909 Unspecified asthma, uncomplicated: Secondary | ICD-10-CM | POA: Insufficient documentation

## 2016-02-17 DIAGNOSIS — Z800 Family history of malignant neoplasm of digestive organs: Secondary | ICD-10-CM | POA: Diagnosis not present

## 2016-02-17 DIAGNOSIS — J449 Chronic obstructive pulmonary disease, unspecified: Secondary | ICD-10-CM | POA: Insufficient documentation

## 2016-02-17 DIAGNOSIS — Z09 Encounter for follow-up examination after completed treatment for conditions other than malignant neoplasm: Secondary | ICD-10-CM | POA: Diagnosis not present

## 2016-02-17 DIAGNOSIS — F17210 Nicotine dependence, cigarettes, uncomplicated: Secondary | ICD-10-CM | POA: Diagnosis not present

## 2016-02-17 DIAGNOSIS — Z79899 Other long term (current) drug therapy: Secondary | ICD-10-CM | POA: Insufficient documentation

## 2016-02-17 DIAGNOSIS — G8929 Other chronic pain: Secondary | ICD-10-CM | POA: Insufficient documentation

## 2016-02-17 DIAGNOSIS — Z7951 Long term (current) use of inhaled steroids: Secondary | ICD-10-CM | POA: Insufficient documentation

## 2016-02-17 DIAGNOSIS — F419 Anxiety disorder, unspecified: Secondary | ICD-10-CM | POA: Diagnosis not present

## 2016-02-17 DIAGNOSIS — Z1211 Encounter for screening for malignant neoplasm of colon: Secondary | ICD-10-CM | POA: Diagnosis not present

## 2016-02-17 HISTORY — DX: Unspecified asthma, uncomplicated: J45.909

## 2016-02-17 HISTORY — DX: Anxiety disorder, unspecified: F41.9

## 2016-02-17 HISTORY — PX: COLONOSCOPY: SHX5424

## 2016-02-17 SURGERY — COLONOSCOPY
Anesthesia: Moderate Sedation

## 2016-02-17 MED ORDER — PROMETHAZINE HCL 25 MG/ML IJ SOLN
INTRAMUSCULAR | Status: AC
  Filled 2016-02-17: qty 1

## 2016-02-17 MED ORDER — MIDAZOLAM HCL 5 MG/5ML IJ SOLN
INTRAMUSCULAR | Status: DC | PRN
  Administered 2016-02-17: 2 mg via INTRAVENOUS
  Administered 2016-02-17: 1 mg via INTRAVENOUS
  Administered 2016-02-17: 2 mg via INTRAVENOUS
  Administered 2016-02-17: 3 mg via INTRAVENOUS

## 2016-02-17 MED ORDER — MIDAZOLAM HCL 5 MG/5ML IJ SOLN
INTRAMUSCULAR | Status: AC
  Filled 2016-02-17: qty 10

## 2016-02-17 MED ORDER — MEPERIDINE HCL 50 MG/ML IJ SOLN
INTRAMUSCULAR | Status: DC | PRN
  Administered 2016-02-17 (×2): 25 mg via INTRAVENOUS

## 2016-02-17 MED ORDER — STERILE WATER FOR IRRIGATION IR SOLN
Status: DC | PRN
  Administered 2016-02-17: 08:00:00

## 2016-02-17 MED ORDER — MEPERIDINE HCL 50 MG/ML IJ SOLN
INTRAMUSCULAR | Status: AC
  Filled 2016-02-17: qty 1

## 2016-02-17 MED ORDER — SODIUM CHLORIDE 0.9 % IV SOLN
INTRAVENOUS | Status: DC
  Administered 2016-02-17: 08:00:00 via INTRAVENOUS

## 2016-02-17 MED ORDER — PROMETHAZINE HCL 25 MG/ML IJ SOLN
INTRAMUSCULAR | Status: DC | PRN
  Administered 2016-02-17: 12.5 mg via INTRAVENOUS

## 2016-02-17 NOTE — Op Note (Signed)
[facility] Patient Name: [name] Procedure Date: 02/17/2016 8:10 AM MRN: [id number] Date of Birth: [birth year] Attending MD: Lionel December , MD CSN: [id number] Age: 60 Admit Type: Outpatient Procedure:                Colonoscopy Indications:              Screening in patient at increased risk: Colorectal                            cancer in mother before age 7, Colon cancer                            screening in patient at increased risk: Family                            history of colorectal cancer in multiple 2nd degree                            relatives, High risk colon cancer surveillance:                            Personal history of colonic polyps Providers:                Lionel December, MD, Jannett Celestine, RN, Edrick Kins, RN Referring MD:             Katharine Look, MD Medicines:                Promethazine 12.5 mg IV, Meperidine 50 mg IV,                            Midazolam 8 mg IV Complications:            No immediate complications. Estimated Blood Loss:     Estimated blood loss: none. Procedure:                Pre-Anesthesia Assessment:                           - Prior to the procedure, a History and Physical                            was performed, and patient medications and                            allergies were reviewed. The patient's tolerance of                            previous anesthesia was also reviewed. The risks                            and benefits of the procedure and the sedation                            options and risks were discussed with the patient.  All questions were answered, and informed consent                            was obtained. Prior Anticoagulants: The patient has                            taken no previous anticoagulant or antiplatelet                            agents. ASA Grade Assessment: II - A patient with                            mild systemic disease. After reviewing the risks                             and benefits, the patient was deemed in                            satisfactory condition to undergo the procedure.                           After obtaining informed consent, the colonoscope                            was passed under direct vision. Throughout the                            procedure, the patient's blood pressure, pulse, and                            oxygen saturations were monitored continuously. The                            [id number] ([id number] scope was introduced through                            the anus and advanced to the the cecum, identified                            by appendiceal orifice and ileocecal valve. The                            colonoscopy was performed without difficulty. The                            patient tolerated the procedure well. The quality                            of the bowel preparation was adequate. The                            ileocecal valve, appendiceal orifice, and rectum  were photographed. Scope In: 8:31:49 AM Scope Out: 8:49:44 AM Total Procedure Duration: 0 hours 17 minutes 55 seconds  Findings:      A few small-mouthed diverticula were found in the descending colon.      Multiple small and large-mouthed diverticula were found in the sigmoid       colon.      The exam was otherwise normal throughout the examined colon.      The retroflexed view of the distal rectum and anal verge was normal and       showed no anal or rectal abnormalities. Impression:               - Diverticulosis in the descending colon.                           - Diverticulosis in the sigmoid colon.                           - No specimens collected. Moderate Sedation:      Moderate (conscious) sedation was administered by the endoscopy nurse       and supervised by the endoscopist. The following parameters were       monitored: oxygen saturation, heart rate, blood pressure, CO2        capnography and response to care. Total physician intraservice time was       22 minutes. Recommendation:           - Patient has a contact number available for                            emergencies. The signs and symptoms of potential                            delayed complications were discussed with the                            patient. Return to normal activities tomorrow.                            Written discharge instructions were provided to the                            patient.                           - High fiber diet today.                           - Resume previous diet.                           - Repeat colonoscopy in 5 years for surveillance.                           - Continue present medications. Procedure Code(s):        --- Professional ---                           [phone],  Colonoscopy, flexible; diagnostic, including                            collection of specimen(s) by brushing or washing,                            when performed (separate procedure)                           99152, Moderate sedation services provided by the                            same physician or other qualified health care                            professional performing the diagnostic or                            therapeutic service that the sedation supports,                            requiring the presence of an independent trained                            observer to assist in the monitoring of the                            patient's level of consciousness and physiological                            status; initial 15 minutes of intraservice time,                            patient age 22 years or older Diagnosis Code(s):        --- Professional ---                           Z80.0, Family history of malignant neoplasm of                            digestive organs                           Z86.010, Personal history of colonic polyps                           K57.30,  Diverticulosis of large intestine without                            perforation or abscess without bleeding CPT copyright 2016 American Medical Association. All rights reserved. The codes documented in this report are preliminary and upon coder review may  be revised to meet current compliance requirements. Lionel December, MD Lionel December, MD 02/17/2016 9:01:15 AM This report has been signed electronically. Number of Addenda: 0

## 2016-02-17 NOTE — Discharge Instructions (Signed)
Resume usual medications and high fiber diet. No driving for 24 hours. Next colonoscopy in 5 years.     High-Fiber Diet Fiber, also called dietary fiber, is a type of carbohydrate found in fruits, vegetables, whole grains, and beans. A high-fiber diet can have many health benefits. Your health care provider may recommend a high-fiber diet to help:  Prevent constipation. Fiber can make your bowel movements more regular.  Lower your cholesterol.  Relieve hemorrhoids, uncomplicated diverticulosis, or irritable bowel syndrome.  Prevent overeating as part of a weight-loss plan.  Prevent heart disease, type 2 diabetes, and certain cancers. WHAT IS MY PLAN? The recommended daily intake of fiber includes:  38 grams for men under age 27.  30 grams for men over age 69.  25 grams for women under age 3.  21 grams for women over age 35. You can get the recommended daily intake of dietary fiber by eating a variety of fruits, vegetables, grains, and beans. Your health care provider may also recommend a fiber supplement if it is not possible to get enough fiber through your diet. WHAT DO I NEED TO KNOW ABOUT A HIGH-FIBER DIET?  Fiber supplements have not been widely studied for their effectiveness, so it is better to get fiber through food sources.  Always check the fiber content on thenutrition facts label of any prepackaged food. Look for foods that contain at least 5 grams of fiber per serving.  Ask your dietitian if you have questions about specific foods that are related to your condition, especially if those foods are not listed in the following section.  Increase your daily fiber consumption gradually. Increasing your intake of dietary fiber too quickly may cause bloating, cramping, or gas.  Drink plenty of water. Water helps you to digest fiber. WHAT FOODS CAN I EAT? Grains Whole-grain breads. Multigrain cereal. Oats and oatmeal. Brown rice. Barley. Bulgur wheat. Millet. Bran  muffins. Popcorn. Rye wafer crackers. Vegetables Sweet potatoes. Spinach. Kale. Artichokes. Cabbage. Broccoli. Green peas. Carrots. Squash. Fruits Berries. Pears. Apples. Oranges. Avocados. Prunes and raisins. Dried figs. Meats and Other Protein Sources Navy, kidney, pinto, and soy beans. Split peas. Lentils. Nuts and seeds. Dairy Fiber-fortified yogurt. Beverages Fiber-fortified soy milk. Fiber-fortified orange juice. Other Fiber bars. The items listed above may not be a complete list of recommended foods or beverages. Contact your dietitian for more options. WHAT FOODS ARE NOT RECOMMENDED? Grains White bread. Pasta made with refined flour. White rice. Vegetables Fried potatoes. Canned vegetables. Well-cooked vegetables.  Fruits Fruit juice. Cooked, strained fruit. Meats and Other Protein Sources Fatty cuts of meat. Fried Environmental education officer or fried fish. Dairy Milk. Yogurt. Cream cheese. Sour cream. Beverages Soft drinks. Other Cakes and pastries. Butter and oils. The items listed above may not be a complete list of foods and beverages to avoid. Contact your dietitian for more information. WHAT ARE SOME TIPS FOR INCLUDING HIGH-FIBER FOODS IN MY DIET?  Eat a wide variety of high-fiber foods.  Make sure that half of all grains consumed each day are whole grains.  Replace breads and cereals made from refined flour or white flour with whole-grain breads and cereals.  Replace white rice with brown rice, bulgur wheat, or millet.  Start the day with a breakfast that is high in fiber, such as a cereal that contains at least 5 grams of fiber per serving.  Use beans in place of meat in soups, salads, or pasta.  Eat high-fiber snacks, such as berries, raw vegetables, nuts, or popcorn.  This information is not intended to replace advice given to you by your health care provider. Make sure you discuss any questions you have with your health care provider.   Document Released: 10/02/2005  Document Revised: 10/23/2014 Document Reviewed: 03/17/2014 Elsevier Interactive Patient Education 2016 [employer] Colonoscopy, Care After These instructions give you information on caring for yourself after your procedure. Your doctor may also give you more specific instructions. Call your doctor if you have any problems or questions after your procedure. HOME CARE  Do not drive for 24 hours.  Do not sign important papers or use machinery for 24 hours.  You may shower.  You may go back to your usual activities, but go slower for the first 24 hours.  Take rest breaks often during the first 24 hours.  Walk around or use warm packs on your belly (abdomen) if you have belly cramping or gas.  Drink enough fluids to keep your pee (urine) clear or pale yellow.  Resume your normal diet. Avoid heavy or fried foods.  Avoid drinking alcohol for 24 hours or as told by your doctor.  Only take medicines as told by your doctor. If a tissue sample (biopsy) was taken during the procedure:   Do not take aspirin or blood thinners for 7 days, or as told by your doctor.  Do not drink alcohol for 7 days, or as told by your doctor.  Eat soft foods for the first 24 hours. GET HELP IF: You still have a small amount of blood in your poop (stool) 2-3 days after the procedure. GET HELP RIGHT AWAY IF:  You have more than a small amount of blood in your poop.  You see clumps of tissue (blood clots) in your poop.  Your belly is puffy (swollen).  You feel sick to your stomach (nauseous) or throw up (vomit).  You have a fever.  You have belly pain that gets worse and medicine does not help. MAKE SURE YOU:  Understand these instructions.  Will watch your condition.  Will get help right away if you are not doing well or get worse.   This information is not intended to replace advice given to you by your health care provider. Make sure you discuss any questions you have with your health care  provider.   Document Released: 11/04/2010 Document Revised: 10/07/2013 Document Reviewed: 06/09/2013 Elsevier Interactive Patient Education [employer]

## 2016-02-17 NOTE — H&P (Signed)
[name] is an 60 y.o. female.   Chief Complaint: Patient is here for colonoscopy. HPI: Patient is 60 year old Caucasian female was history of colonic adenomas whose last exam was in 2006 is here for surveillance colonoscopy. She has chronic constipation and is doing much better with polyethylene glycol. She has intermittent hematochezia with straining. She denies frank bleeding. She denies abdominal pain. She was advised to return in 5 years but she decided to wait. History significant for CRC in mother who was in her late 6050s and died at 5761. She lost paternal uncle of colon carcinoma in his mid 3850s. He died within few months. Family history significant for other malignancies. Sister died at 8549 possibly of metastatic lung cancer. Another brother was diagnosed with cancer at age 61 in hand himself(no further details available).  Past Medical History  Diagnosis Date  . COPD (chronic obstructive pulmonary disease) (HCC)   . Chronic back pain   . Asthma   . Anxiety     Past Surgical History  Procedure Laterality Date  . Hernia repair    . Abdominal hysterectomy    . Bladder repair      Family History  Problem Relation Age of Onset  . Colon cancer Other    Social History:  reports that she has been smoking Cigarettes.  She has a 23 pack-year smoking history. She does not have any smokeless tobacco history on file. She reports that she does not drink alcohol or use illicit drugs.  Allergies:  Allergies  Allergen Reactions  . Amoxicillin Other (See Comments)    REACTION: Tongue ulcers (Patient states that she is not aware of this allergy)     Medications Prior to Admission  Medication Sig Dispense Refill  . albuterol (PROVENTIL HFA;VENTOLIN HFA) 108 (90 Base) MCG/ACT inhaler Inhale 1 puff into the lungs every 6 (six) hours as needed for wheezing or shortness of breath.    . CHERATUSSIN AC 100-10 MG/5ML syrup Take 5 mLs by mouth 3 (three) times daily as needed.     . fentaNYL  (DURAGESIC - DOSED MCG/HR) 100 MCG/HR Place 50 mcg onto the skin every 3 (three) days.     . penicillin v potassium (VEETID) 500 MG tablet Take 500 mg by mouth 3 (three) times daily.     . polyethylene glycol (MIRALAX / GLYCOLAX) packet Take 17 g by mouth daily.    . polyethylene glycol-electrolytes (NULYTELY/GOLYTELY) 420 g solution Take 4,000 mLs by mouth once. 4000 mL 0  . promethazine (PHENERGAN) 25 MG tablet Take 25 mg by mouth every 6 (six) hours as needed for nausea or vomiting.    . budesonide-formoterol (SYMBICORT) 160-4.5 MCG/ACT inhaler Inhale 2 puffs into the lungs every 4 (four) hours as needed (for shortness of breath).    . meloxicam (MOBIC) 7.5 MG tablet Take 7.5 mg by mouth daily.      No results found for this or any previous visit (from the past 48 hour(s)). No results found.  ROS  Blood pressure 119/82, pulse 78, temperature 97.6 F (36.4 C), temperature source Oral, resp. rate 17, height 5' (1.524 m), weight 117 lb (53.071 kg), SpO2 96 %. Physical Exam  Constitutional: She appears well-developed and well-nourished.  HENT:  Mouth/Throat: Oropharynx is clear and moist.  Eyes: Conjunctivae are normal. No scleral icterus.  Neck: No thyromegaly present.  Cardiovascular: Normal rate, regular rhythm and normal heart sounds.   No murmur heard. Respiratory: Effort normal and breath sounds normal.  GI: Soft. She  exhibits no distension and no mass. There is no tenderness.  Musculoskeletal: She exhibits no edema.  Lymphadenopathy:    She has no cervical adenopathy.  Neurological: She is alert.  Skin: Skin is warm and dry.     Assessment/Plan History of colonic polyps. Family history of colon carcinoma in first and second-degree relatives. Surveillance colonoscopy.  Malissa Hippo, MD 02/17/2016, 8:22 AM

## 2016-02-18 ENCOUNTER — Encounter (HOSPITAL_COMMUNITY): Payer: Self-pay | Admitting: Internal Medicine

## 2016-02-28 ENCOUNTER — Encounter (HOSPITAL_COMMUNITY): Payer: Self-pay

## 2016-02-28 ENCOUNTER — Emergency Department (HOSPITAL_COMMUNITY)
Admission: EM | Admit: 2016-02-28 | Discharge: 2016-02-28 | Disposition: A | Discharge status: Home / Self Care | Payer: Medicare HMO | Attending: Emergency Medicine | Admitting: Emergency Medicine

## 2016-02-28 ENCOUNTER — Emergency Department (HOSPITAL_COMMUNITY): Payer: Medicare HMO

## 2016-02-28 DIAGNOSIS — F17210 Nicotine dependence, cigarettes, uncomplicated: Secondary | ICD-10-CM | POA: Diagnosis not present

## 2016-02-28 DIAGNOSIS — J449 Chronic obstructive pulmonary disease, unspecified: Secondary | ICD-10-CM | POA: Diagnosis not present

## 2016-02-28 DIAGNOSIS — S93602A Unspecified sprain of left foot, initial encounter: Secondary | ICD-10-CM | POA: Diagnosis not present

## 2016-02-28 DIAGNOSIS — Y9301 Activity, walking, marching and hiking: Secondary | ICD-10-CM | POA: Diagnosis not present

## 2016-02-28 DIAGNOSIS — W010XXA Fall on same level from slipping, tripping and stumbling without subsequent striking against object, initial encounter: Secondary | ICD-10-CM | POA: Diagnosis not present

## 2016-02-28 DIAGNOSIS — Y999 Unspecified external cause status: Secondary | ICD-10-CM | POA: Insufficient documentation

## 2016-02-28 DIAGNOSIS — J45909 Unspecified asthma, uncomplicated: Secondary | ICD-10-CM | POA: Insufficient documentation

## 2016-02-28 DIAGNOSIS — Y929 Unspecified place or not applicable: Secondary | ICD-10-CM | POA: Diagnosis not present

## 2016-02-28 DIAGNOSIS — S99922A Unspecified injury of left foot, initial encounter: Secondary | ICD-10-CM | POA: Diagnosis present

## 2016-02-28 NOTE — Discharge Instructions (Signed)
Your xray is negative for fracture or dislocation. Your exam is consistent with a foot sprain.  Use your crutches until you can safely apply weight to the  Lower extremity. Foot Sprain A foot sprain is an injury to one of the strong bands of tissue (ligaments) that connect and support the many bones in your feet. The ligament can be stretched too much or it can tear. A tear can be either partial or complete. The severity of the sprain depends on how much of the ligament was damaged or torn. CAUSES A foot sprain is usually caused by suddenly twisting or pivoting your foot. RISK FACTORS This injury is more likely to occur in people who:  Play a sport, such as basketball or football.  Exercise or play a sport without warming up.  Start a new workout or sport.  Suddenly increase how long or hard they exercise or play a sport. SYMPTOMS Symptoms of this condition start soon after an injury and include:  Pain, especially in the arch of the foot.  Bruising.  Swelling.  Inability to walk or use the foot to support body weight. DIAGNOSIS This condition is diagnosed with a medical history and physical exam. You may also have imaging tests, such as:  X-rays to make sure there are no broken bones (fractures).  MRI to see if the ligament has torn. TREATMENT Treatment varies depending on the severity of your sprain. Mild sprains can be treated with rest, ice, compression, and elevation (RICE). If your ligament is overstretched or partially torn, treatment usually involves keeping your foot in a fixed position (immobilization) for a period of time. To help you do this, your health care provider will apply a bandage, splint, or walking boot to keep your foot from moving until it heals. You may also be advised to use crutches or a scooter for a few weeks to avoid bearing weight on your foot while it is healing. If your ligament is fully torn, you may need surgery to reconnect the ligament to the bone.  After surgery, a cast or splint will be applied and will need to stay on your foot while it heals. Your health care provider may also suggest exercises or physical therapy to strengthen your foot. HOME CARE INSTRUCTIONS If You Have a Bandage, Splint, or Walking Boot:  Wear it as directed by your health care provider. Remove it only as directed by your health care provider.  Loosen the bandage, splint, or walking boot if your toes become numb and tingle, or if they turn cold and blue. Bathing  If your health care provider approves bathing and showering, cover the bandage or splint with a watertight plastic bag to protect it from water. Do not let the bandage or splint get wet. Managing Pain, Stiffness, and Swelling   If directed, apply ice to the injured area:  Put ice in a plastic bag.  Place a towel between your skin and the bag.  Leave the ice on for 20 minutes, 2-3 times per day.  Move your toes often to avoid stiffness and to lessen swelling.  Raise (elevate) the injured area above the level of your heart while you are sitting or lying down. Driving  Do not drive or operate heavy machinery while taking pain medicine.  Do not drive while wearing a bandage, splint, or walking boot on a foot that you use for driving. Activity  Rest as directed by your health care provider.  Do not use the injured foot to  support your body weight until your health care provider says that you can. Use crutches or other supportive devices as directed by your health care provider.  Ask your health care provider what activities are safe for you. Gradually increase how much and how far you walk until your health care provider says it is safe to return to full activity.  Do any exercise or physical therapy as directed by your health care provider. General Instructions  If a splint was applied, do not put pressure on any part of it until it is fully hardened. This may take several hours.  Take  medicines only as directed by your health care provider. These include over-the-counter medicines and prescription medicines.  Keep all follow-up visits as directed by your health care provider. This is important.  When you can walk without pain, wear supportive shoes that have stiff soles. Do not wear flip-flops, and do not walk barefoot. SEEK MEDICAL CARE IF:  Your pain is not controlled with medicine.  Your bruising or swelling gets worse or does not get better with treatment.  Your splint or walking boot is damaged. SEEK IMMEDIATE MEDICAL CARE IF:  Your foot is numb or blue.  Your foot feels colder than normal.   This information is not intended to replace advice given to you by your health care provider. Make sure you discuss any questions you have with your health care provider.   Document Released: 03/24/2002 Document Revised: 02/16/2015 Document Reviewed: 08/05/2014 Elsevier Interactive Patient Education [employer]2016 [employer] Inc.

## 2016-02-28 NOTE — ED Notes (Signed)
Pt reports walking her dog this morning and when dog stopped resulting in pt falling and injuring left foot

## 2016-02-28 NOTE — ED Notes (Signed)
Ice pack applied to left foot,

## 2016-02-28 NOTE — ED Provider Notes (Signed)
CSN: [id number]     Arrival date & time 02/28/16  1207 History  By signing my name below, I, [name], attest that this documentation has been prepared under the direction and in the presence of [name] Quale, PA-C. Electronically Signed: Placido [name], ED Scribe. 02/28/2016. 2:45 PM.   Chief Complaint  Patient presents with  . Foot Pain   Patient is a 60 y.o. female presenting with lower extremity pain. The history is provided by the patient. No language interpreter was used.  Foot Pain This is a new problem. The current episode started 3 to 5 hours ago. The problem occurs rarely. The problem has not changed since onset.The symptoms are aggravated by walking and standing. Nothing relieves the symptoms. She has tried nothing for the symptoms.    HPI Comments: [name] is a 59 y.o. female who presents to the Emergency Department complaining of a fall that occurred this morning at 8:00 am. Pt was walking her dog and due to her dog suddenly stopping, tripped and fell to the ground. She reports associated, moderate, left superior and lateral foot pain. Her pain worsens with movement, palpation or when bearing weight. She denies head trauma, LOC or any other associated symptoms at this time.   Past Medical History  Diagnosis Date  . COPD (chronic obstructive pulmonary disease) (HCC)   . Chronic back pain   . Asthma   . Anxiety    Past Surgical History  Procedure Laterality Date  . Hernia repair    . Abdominal hysterectomy    . Bladder repair    . Colonoscopy N/A 02/17/2016    Procedure: COLONOSCOPY;  Surgeon: Malissa Hippo, MD;  Location: [facility];  Service: Endoscopy;  Laterality: N/A;  830   Family History  Problem Relation Age of Onset  . Colon cancer Other    Social History  Substance Use Topics  . Smoking status: Current Every Day Smoker -- 0.50 packs/day for 46 years    Types: Cigarettes  . Smokeless tobacco: None  . Alcohol Use: No   OB History    No  data available     Review of Systems  Musculoskeletal: Positive for myalgias and arthralgias.  Skin: Positive for wound.  Neurological: Negative for syncope.  All other systems reviewed and are negative.  Allergies  Amoxicillin  Home Medications   Prior to Admission medications   Medication Sig Start Date End Date Taking? Authorizing Provider  albuterol (PROVENTIL HFA;VENTOLIN HFA) 108 (90 Base) MCG/ACT inhaler Inhale 1 puff into the lungs every 6 (six) hours as needed for wheezing or shortness of breath.    Historical Provider, MD  budesonide-formoterol (SYMBICORT) 160-4.5 MCG/ACT inhaler Inhale 2 puffs into the lungs every 4 (four) hours as needed (for shortness of breath).    Historical Provider, MD  CHERATUSSIN AC 100-10 MG/5ML syrup Take 5 mLs by mouth 3 (three) times daily as needed.  11/09/15   Historical Provider, MD  fentaNYL (DURAGESIC - DOSED MCG/HR) 100 MCG/HR Place 50 mcg onto the skin every 3 (three) days.     Historical Provider, MD  meloxicam (MOBIC) 7.5 MG tablet Take 7.5 mg by mouth daily.    Historical Provider, MD  penicillin v potassium (VEETID) 500 MG tablet Take 500 mg by mouth 3 (three) times daily.  11/01/15   Historical Provider, MD  polyethylene glycol (MIRALAX / GLYCOLAX) packet Take 17 g by mouth daily.    Historical Provider, MD  promethazine (PHENERGAN) 25 MG tablet Take  25 mg by mouth every 6 (six) hours as needed for nausea or vomiting.    Historical Provider, MD   BP 124/80 mmHg  Pulse 87  Temp(Src) 97.9 F (36.6 C) (Oral)  Resp 16  Ht 5' (1.524 m)  Wt 117 lb (53.071 kg)  BMI 22.85 kg/m2  SpO2 98%    Physical Exam  Constitutional: She is oriented to person, place, and time. She appears well-developed and well-nourished.  HENT:  Head: Normocephalic and atraumatic.  Neck: Normal range of motion.  Cardiovascular: Normal rate.   Pulses:      Dorsalis pedis pulses are 2+ on the left side.  Pulmonary/Chest: Effort normal. No respiratory distress.   Abdominal: Soft.  Musculoskeletal: Normal range of motion. She exhibits tenderness.  Left Leg: Achilles tendon intact. No deformity of the tibial region. Abrasions of the lateral knee. TTP along lateral malleolus of left foot. Cap refill <2 seconds.   Left Arm: Abrasion to palmar surface of left hand. FROM of fingers of left hand. FROM of left wrist.   Neurological: She is alert and oriented to person, place, and time.  Skin: Skin is warm and dry.  Psychiatric: She has a normal mood and affect.  Nursing note and vitals reviewed.   ED Course  Procedures  DIAGNOSTIC STUDIES: Oxygen Saturation is 98% on RA, normal by my interpretation.    COORDINATION OF CARE: 2:40 PM Discussed imaging results and next steps with pt. She verbalized understanding and is agreeable with the plan.   Labs Review Labs Reviewed - No data to display  Imaging Review Dg Foot Complete Left  02/28/2016  CLINICAL DATA:  Fall while walking dog this morning with lateral foot pain, initial encounter EXAM: LEFT FOOT - COMPLETE 3+ VIEW COMPARISON:  None. FINDINGS: There is no evidence of fracture or dislocation. There is no evidence of arthropathy or other focal bone abnormality. Soft tissues are unremarkable. IMPRESSION: No acute abnormality noted. Electronically Signed   By: Alcide CleverMark  Lukens M.D.   On: 02/28/2016 13:10   I have personally reviewed and evaluated these images as part of my medical decision-making.   EKG Interpretation None      MDM  There no gross neurovascular deficits appreciated involving the left lower extremity with right lower extremity. X-ray of the left foot is negative for acute abnormality. The examination favors foot sprain. Patient the patient is fitted with an ankle stirrup splint, and crutches. Ice pack is been provided. Patient is to follow-up with orthopedics if not improving.    Final diagnoses:  Foot sprain, left, initial encounter    *I have reviewed nursing notes, vital signs,  and all appropriate lab and imaging results for this patient.**  **I personally performed the services described in this documentation, which was scribed in my presence. The recorded information has been reviewed and is accurate.   [name] QualeHobson Carnelius Hammitt, PA-C 02/28/16 2136  Geoffery Lyonsouglas Delo, MD 02/29/16 [phone]

## 2016-04-29 ENCOUNTER — Emergency Department (HOSPITAL_COMMUNITY)
Admission: EM | Admit: 2016-04-29 | Discharge: 2016-04-29 | Disposition: A | Discharge status: Home / Self Care | Payer: Medicare HMO | Attending: Emergency Medicine | Admitting: Emergency Medicine

## 2016-04-29 ENCOUNTER — Encounter (HOSPITAL_COMMUNITY): Payer: Self-pay | Admitting: Emergency Medicine

## 2016-04-29 ENCOUNTER — Emergency Department (HOSPITAL_COMMUNITY): Payer: Medicare HMO

## 2016-04-29 DIAGNOSIS — J45909 Unspecified asthma, uncomplicated: Secondary | ICD-10-CM | POA: Diagnosis not present

## 2016-04-29 DIAGNOSIS — M79631 Pain in right forearm: Secondary | ICD-10-CM | POA: Insufficient documentation

## 2016-04-29 DIAGNOSIS — Z79899 Other long term (current) drug therapy: Secondary | ICD-10-CM | POA: Diagnosis not present

## 2016-04-29 DIAGNOSIS — M79641 Pain in right hand: Secondary | ICD-10-CM | POA: Diagnosis not present

## 2016-04-29 DIAGNOSIS — J449 Chronic obstructive pulmonary disease, unspecified: Secondary | ICD-10-CM | POA: Insufficient documentation

## 2016-04-29 DIAGNOSIS — M25531 Pain in right wrist: Secondary | ICD-10-CM | POA: Diagnosis not present

## 2016-04-29 DIAGNOSIS — F17210 Nicotine dependence, cigarettes, uncomplicated: Secondary | ICD-10-CM | POA: Diagnosis not present

## 2016-04-29 NOTE — ED Notes (Signed)
Patient c/o right wrist/arm pain. Per patient on chair trying to put curtain on rod when it fell over. Denies hitting head, LOC, or dizziness. Limited ROM. No obvious deformity noted.

## 2016-04-29 NOTE — Discharge Instructions (Signed)
Take your usual prescriptions as previously directed.  Apply moist heat or ice to the area(s) of discomfort, for 15 minutes at a time, several times per day for the next few days.  Do not fall asleep on a heating or ice pack. Wear the splint and use the sling until you are seen in follow up.  Call the Orthopedic doctor on Monday to schedule a follow up appointment this week.  Return to the Emergency Department immediately if worsening.

## 2016-04-29 NOTE — ED Provider Notes (Addendum)
CSN: [id number]     Arrival date & time 04/29/16  [id number] History   First MD Initiated Contact with Patient 04/29/16 1006     Chief Complaint  Patient presents with  . Wrist Pain      HPI  Pt was seen at 1010. Per pt, c/o sudden onset and resolution of one episode of "fall" that occurred PTA. Pt states she was handing a curtain on a rod when she fell, landing on her right forearm/wrist/hand. States the pain worsens with movement. Denies any other injuries.  Denies hitting head, no LOC/syncope, no AMS, no neck or back pain, no CP/SOB, no abd pain.    Past Medical History  Diagnosis Date  . COPD (chronic obstructive pulmonary disease) (HCC)   . Chronic back pain   . Asthma   . Anxiety    Past Surgical History  Procedure Laterality Date  . Hernia repair    . Abdominal hysterectomy    . Bladder repair    . Colonoscopy N/A 02/17/2016    Procedure: COLONOSCOPY;  Surgeon: Malissa HippoNajeeb U Rehman, MD;  Location: [facility];  Service: Endoscopy;  Laterality: N/A;  830   Family History  Problem Relation Age of Onset  . Colon cancer Other    Social History  Substance Use Topics  . Smoking status: Current Every Day Smoker -- 0.50 packs/day for 46 years    Types: Cigarettes  . Smokeless tobacco: Never Used  . Alcohol Use: No   OB History    Gravida Para Term Preterm AB TAB SAB Ectopic Multiple Living   4 4 4       3      Review of Systems ROS: Statement: All systems negative except as marked or noted in the HPI; Constitutional: Negative for fever and chills. ; ; Eyes: Negative for eye pain, redness and discharge. ; ; ENMT: Negative for ear pain, hoarseness, nasal congestion, sinus pressure and sore throat. ; ; Cardiovascular: Negative for chest pain, palpitations, diaphoresis, dyspnea and peripheral edema. ; ; Respiratory: Negative for cough, wheezing and stridor. ; ; Gastrointestinal: Negative for nausea, vomiting, diarrhea, abdominal pain, blood in stool, hematemesis, jaundice and rectal  bleeding. . ; ; Genitourinary: Negative for dysuria, flank pain and hematuria. ; ; Musculoskeletal: +right forearm/wrist/hand pain. Negative for back pain and neck pain. Negative for swelling and deformity.; ; Skin: Negative for pruritus, rash, abrasions, blisters, bruising and skin lesion.; ; Neuro: Negative for headache, lightheadedness and neck stiffness. Negative for weakness, altered level of consciousness, altered mental status, extremity weakness, paresthesias, involuntary movement, seizure and syncope.      Allergies  Amoxicillin  Home Medications   Prior to Admission medications   Medication Sig Start Date End Date Taking? Authorizing Provider  albuterol (PROVENTIL HFA;VENTOLIN HFA) 108 (90 Base) MCG/ACT inhaler Inhale 1 puff into the lungs every 6 (six) hours as needed for wheezing or shortness of breath.   Yes Historical Provider, MD  budesonide-formoterol (SYMBICORT) 160-4.5 MCG/ACT inhaler Inhale 2 puffs into the lungs every 4 (four) hours as needed (for shortness of breath).   Yes Historical Provider, MD  fentaNYL (DURAGESIC - DOSED MCG/HR) 100 MCG/HR Place 50 mcg onto the skin every 3 (three) days.    Yes Historical Provider, MD  polyethylene glycol (MIRALAX / GLYCOLAX) packet Take 17 g by mouth daily.   Yes Historical Provider, MD   BP 112/66 mmHg  Pulse 76  Temp(Src) 98.5 F (36.9 C) (Oral)  Resp 16  Ht 5' (1.524 m)  Wt 115 lb (52.164 kg)  BMI 22.46 kg/m2  SpO2 96% Physical Exam  1015: Physical examination:  Nursing notes reviewed; Vital signs and O2 SAT reviewed;  Constitutional: Well developed, Well nourished, Well hydrated, In no acute distress; Head:  Normocephalic, atraumatic; Eyes: EOMI, PERRL, No scleral icterus; ENMT: Mouth and pharynx normal, Mucous membranes moist; Neck: Supple, Full range of motion, No lymphadenopathy; Cardiovascular: Regular rate and rhythm, No gallop; Respiratory: Breath sounds clear & equal bilaterally, No wheezes.  Speaking full sentences  with ease, Normal respiratory effort/excursion; Chest: Nontender, Movement normal; Abdomen: Soft, Nontender, Nondistended, Normal bowel sounds; Genitourinary: No CVA tenderness; Extremities: NT right shoulder/elbow/fingers. +generalized TTP right distal forearm, wrist, hand. Forearm compartments soft, strong radial pp, brisk cap refill in fingers. Right hand NMS intact.  Decreased ROM F/E right wrist d/t c/o pain.  No deformity, no open wounds, no ecchymosis, no abrasions, no rash. No edema..; Neuro: AA&Ox3, Major CN grossly intact.  Speech clear. No gross focal motor or sensory deficits in extremities.; Skin: Color normal, Warm, Dry.   ED Course  Procedures (including critical care time) Labs Review  Imaging Review  I have personally reviewed and evaluated these images and lab results as part of my medical decision-making.   EKG Interpretation None      MDM  MDM Reviewed: previous chart, nursing note and vitals Interpretation: x-ray    Dg Elbow Complete Right 04/29/2016  CLINICAL DATA:  Right elbow pain after fall today. Initial encounter. EXAM: RIGHT ELBOW - COMPLETE 3+ VIEW COMPARISON:  None. FINDINGS: There is no evidence of fracture, dislocation, or joint effusion. There is no evidence of arthropathy or other focal bone abnormality. Soft tissues are unremarkable. IMPRESSION: Normal right elbow. Electronically Signed   By: Lupita Raider, M.D.   On: 04/29/2016 11:42   Dg Forearm Right 04/29/2016  CLINICAL DATA:  59 year old female with a history of fall EXAM: RIGHT FOREARM - 2 VIEW COMPARISON:  None. FINDINGS: No acute fracture identified. No focal soft tissue swelling. No radiopaque foreign body. Lateral view of the elbow demonstrates no evidence of joint effusion. IMPRESSION: Negative for acute bony abnormalities. Signed, Yvone Neu. Loreta Ave, DO [facility] Electronically Signed   By: Gilmer Mor D.O.   On: 04/29/2016 11:41    Dg Wrist Complete Right 04/29/2016  CLINICAL DATA:  60 year old female with a history of fall. Right hand pain EXAM: RIGHT WRIST - COMPLETE 3+ VIEW COMPARISON:  None. FINDINGS: No acute fracture. No focal soft tissue swelling. No radiopaque foreign body. Carpal bones maintain alignment. Unremarkable scaphoid view. IMPRESSION: Negative for acute bony abnormality. Signed, Yvone Neu. Loreta Ave, DO [facility] Electronically Signed   By: Gilmer Mor D.O.   On: 04/29/2016 11:39   Dg Hand Complete Right 04/29/2016  CLINICAL DATA:  Acute right hand pain after fall today. Initial encounter. EXAM: RIGHT HAND - COMPLETE 3+ VIEW COMPARISON:  None. FINDINGS: There is no evidence of fracture or dislocation. There is no evidence of arthropathy or other focal bone abnormality. Soft tissues are unremarkable. IMPRESSION: Normal right hand. Electronically Signed   By: Lupita Raider, M.D.   On: 04/29/2016 11:40    1150:  XR reassuring. Tx volar splint, f/u Ortho MD. Dx and testing d/w pt and family.  Questions answered.  Verb understanding, agreeable to d/c home with outpt f/u.   Samuel Jester, DO 05/01/16 2211  Samuel Jester, DO 05/08/16 [phone]

## 2016-06-07 ENCOUNTER — Other Ambulatory Visit (HOSPITAL_COMMUNITY): Payer: Self-pay | Admitting: Family Medicine

## 2016-06-07 DIAGNOSIS — Z1231 Encounter for screening mammogram for malignant neoplasm of breast: Secondary | ICD-10-CM

## 2016-08-07 ENCOUNTER — Ambulatory Visit (HOSPITAL_COMMUNITY)
Admission: RE | Admit: 2016-08-07 | Discharge: 2016-08-07 | Disposition: A | Discharge status: Home / Self Care | Payer: Medicare HMO | Source: Ambulatory Visit | Attending: Family Medicine | Admitting: Family Medicine

## 2016-08-07 DIAGNOSIS — Z1231 Encounter for screening mammogram for malignant neoplasm of breast: Secondary | ICD-10-CM

## 2016-11-07 ENCOUNTER — Other Ambulatory Visit (HOSPITAL_COMMUNITY): Payer: Self-pay | Admitting: Family Medicine

## 2016-11-07 DIAGNOSIS — M545 Low back pain: Secondary | ICD-10-CM

## 2016-11-13 ENCOUNTER — Ambulatory Visit (HOSPITAL_COMMUNITY): Payer: Medicare HMO

## 2016-11-17 ENCOUNTER — Ambulatory Visit (HOSPITAL_COMMUNITY): Payer: Medicare HMO

## 2016-11-27 ENCOUNTER — Encounter (HOSPITAL_COMMUNITY): Payer: Self-pay

## 2016-11-27 ENCOUNTER — Ambulatory Visit (HOSPITAL_COMMUNITY): Payer: Medicare HMO

## 2016-11-27 ENCOUNTER — Ambulatory Visit (HOSPITAL_COMMUNITY)
Admission: RE | Admit: 2016-11-27 | Discharge: 2016-11-27 | Disposition: A | Discharge status: Home / Self Care | Payer: Medicare HMO | Source: Ambulatory Visit | Attending: Family Medicine | Admitting: Family Medicine

## 2016-11-27 DIAGNOSIS — M5136 Other intervertebral disc degeneration, lumbar region: Secondary | ICD-10-CM | POA: Diagnosis not present

## 2016-11-27 DIAGNOSIS — M5137 Other intervertebral disc degeneration, lumbosacral region: Secondary | ICD-10-CM | POA: Insufficient documentation

## 2016-11-27 DIAGNOSIS — K5730 Diverticulosis of large intestine without perforation or abscess without bleeding: Secondary | ICD-10-CM | POA: Insufficient documentation

## 2016-11-27 DIAGNOSIS — M545 Low back pain: Secondary | ICD-10-CM | POA: Diagnosis present

## 2016-11-27 DIAGNOSIS — M47816 Spondylosis without myelopathy or radiculopathy, lumbar region: Secondary | ICD-10-CM | POA: Diagnosis not present

## 2016-12-18 ENCOUNTER — Other Ambulatory Visit (HOSPITAL_COMMUNITY): Payer: Self-pay | Admitting: Family Medicine

## 2016-12-18 DIAGNOSIS — Z122 Encounter for screening for malignant neoplasm of respiratory organs: Secondary | ICD-10-CM

## 2016-12-28 ENCOUNTER — Encounter (HOSPITAL_COMMUNITY): Payer: Self-pay

## 2016-12-28 ENCOUNTER — Ambulatory Visit (HOSPITAL_COMMUNITY): Payer: Medicare HMO

## 2017-02-14 ENCOUNTER — Other Ambulatory Visit: Payer: Self-pay | Admitting: Family Medicine

## 2017-02-14 DIAGNOSIS — G8929 Other chronic pain: Secondary | ICD-10-CM

## 2017-02-14 DIAGNOSIS — M545 Low back pain: Principal | ICD-10-CM

## 2017-02-19 ENCOUNTER — Ambulatory Visit
Admission: RE | Admit: 2017-02-19 | Discharge: 2017-02-19 | Disposition: A | Discharge status: Home / Self Care | Payer: Medicare HMO | Source: Ambulatory Visit | Attending: Family Medicine | Admitting: Family Medicine

## 2017-02-19 DIAGNOSIS — G8929 Other chronic pain: Secondary | ICD-10-CM

## 2017-02-19 DIAGNOSIS — M545 Low back pain: Principal | ICD-10-CM

## 2017-02-19 MED ORDER — METHYLPREDNISOLONE ACETATE 40 MG/ML INJ SUSP (RADIOLOG
120.0000 mg | Freq: Once | INTRAMUSCULAR | Status: AC
  Administered 2017-02-19: 120 mg via EPIDURAL

## 2017-02-19 MED ORDER — IOPAMIDOL (ISOVUE-M 200) INJECTION 41%
1.0000 mL | Freq: Once | INTRAMUSCULAR | Status: AC
  Administered 2017-02-19: 1 mL via EPIDURAL

## 2017-02-19 NOTE — Discharge Instructions (Signed)

## 2017-02-21 ENCOUNTER — Other Ambulatory Visit: Payer: Medicare HMO

## 2017-03-19 ENCOUNTER — Ambulatory Visit
Admission: RE | Admit: 2017-03-19 | Discharge: 2017-03-19 | Disposition: A | Discharge status: Home / Self Care | Payer: Medicare HMO | Source: Ambulatory Visit | Attending: Family Medicine | Admitting: Family Medicine

## 2017-03-19 ENCOUNTER — Other Ambulatory Visit: Payer: Self-pay | Admitting: Family Medicine

## 2017-03-19 DIAGNOSIS — M545 Low back pain: Principal | ICD-10-CM

## 2017-03-19 DIAGNOSIS — G8929 Other chronic pain: Secondary | ICD-10-CM

## 2017-03-19 MED ORDER — IOPAMIDOL (ISOVUE-M 200) INJECTION 41%
1.0000 mL | Freq: Once | INTRAMUSCULAR | Status: AC
  Administered 2017-03-19: 1 mL via EPIDURAL

## 2017-03-19 MED ORDER — METHYLPREDNISOLONE ACETATE 40 MG/ML INJ SUSP (RADIOLOG
120.0000 mg | Freq: Once | INTRAMUSCULAR | Status: AC
  Administered 2017-03-19: 120 mg via EPIDURAL

## 2017-03-29 ENCOUNTER — Ambulatory Visit (HOSPITAL_COMMUNITY)
Admission: RE | Admit: 2017-03-29 | Discharge: 2017-03-29 | Disposition: A | Discharge status: Home / Self Care | Payer: Medicare HMO | Source: Ambulatory Visit | Attending: Family Medicine | Admitting: Family Medicine

## 2017-03-29 DIAGNOSIS — F17210 Nicotine dependence, cigarettes, uncomplicated: Secondary | ICD-10-CM | POA: Diagnosis present

## 2017-03-29 DIAGNOSIS — I700 Atherosclerosis of aorta: Secondary | ICD-10-CM | POA: Insufficient documentation

## 2017-03-29 DIAGNOSIS — K449 Diaphragmatic hernia without obstruction or gangrene: Secondary | ICD-10-CM | POA: Insufficient documentation

## 2017-03-29 DIAGNOSIS — J449 Chronic obstructive pulmonary disease, unspecified: Secondary | ICD-10-CM | POA: Diagnosis not present

## 2017-03-29 DIAGNOSIS — Z807 Family history of other malignant neoplasms of lymphoid, hematopoietic and related tissues: Secondary | ICD-10-CM | POA: Diagnosis not present

## 2017-03-29 DIAGNOSIS — Z122 Encounter for screening for malignant neoplasm of respiratory organs: Secondary | ICD-10-CM | POA: Diagnosis present

## 2018-01-21 ENCOUNTER — Ambulatory Visit (INDEPENDENT_AMBULATORY_CARE_PROVIDER_SITE_OTHER): Payer: Medicare HMO | Admitting: Internal Medicine

## 2018-01-21 ENCOUNTER — Encounter (INDEPENDENT_AMBULATORY_CARE_PROVIDER_SITE_OTHER): Payer: Self-pay | Admitting: Internal Medicine

## 2018-02-26 ENCOUNTER — Ambulatory Visit: Payer: Medicare HMO | Admitting: Cardiology

## 2018-02-26 NOTE — Progress Notes (Deleted)
     Clinical Summary [name] is a 62 y.o.female seen as new consult   1. Chest pain -  Past Medical History:  Diagnosis Date  . Anxiety   . Asthma   . Chronic back pain   . COPD (chronic obstructive pulmonary disease) (HCC)      Allergies  Allergen Reactions  . Amoxicillin Other (See Comments)    REACTION: Tongue ulcers (Patient states that she is not aware of this allergy)      Current Outpatient Medications  Medication Sig Dispense Refill  . albuterol (PROVENTIL HFA;VENTOLIN HFA) 108 (90 Base) MCG/ACT inhaler Inhale 1 puff into the lungs every 6 (six) hours as needed for wheezing or shortness of breath.    . budesonide-formoterol (SYMBICORT) 160-4.5 MCG/ACT inhaler Inhale 2 puffs into the lungs every 4 (four) hours as needed (for shortness of breath).    . diazepam (VALIUM) 10 MG tablet Take 10 mg by mouth at bedtime as needed for anxiety.    Marland Kitchen HYDROcodone-acetaminophen (NORCO) 10-325 MG tablet Take 1 tablet by mouth 3 (three) times daily.     No current facility-administered medications for this visit.      Past Surgical History:  Procedure Laterality Date  . ABDOMINAL HYSTERECTOMY    . BLADDER REPAIR    . COLONOSCOPY N/A 02/17/2016   Procedure: COLONOSCOPY;  Surgeon: Malissa Hippo, MD;  Location: [facility];  Service: Endoscopy;  Laterality: N/A;  830  . HERNIA REPAIR       Allergies  Allergen Reactions  . Amoxicillin Other (See Comments)    REACTION: Tongue ulcers (Patient states that she is not aware of this allergy)       Family History  Problem Relation Age of Onset  . Colon cancer Other      Social History [name] reports that she has been smoking cigarettes.  She has a 23.00 pack-year smoking history. She has never used smokeless tobacco. [name] reports that she does not drink alcohol.   Review of Systems CONSTITUTIONAL: No weight loss, fever, chills, weakness or fatigue.  HEENT: Eyes: No visual loss, blurred vision, double  vision or yellow sclerae.No hearing loss, sneezing, congestion, runny nose or sore throat.  SKIN: No rash or itching.  CARDIOVASCULAR:  RESPIRATORY: No shortness of breath, cough or sputum.  GASTROINTESTINAL: No anorexia, nausea, vomiting or diarrhea. No abdominal pain or blood.  GENITOURINARY: No burning on urination, no polyuria NEUROLOGICAL: No headache, dizziness, syncope, paralysis, ataxia, numbness or tingling in the extremities. No change in bowel or bladder control.  MUSCULOSKELETAL: No muscle, back pain, joint pain or stiffness.  LYMPHATICS: No enlarged nodes. No history of splenectomy.  PSYCHIATRIC: No history of depression or anxiety.  ENDOCRINOLOGIC: No reports of sweating, cold or heat intolerance. No polyuria or polydipsia.  Marland Kitchen   Physical Examination There were no vitals filed for this visit. There were no vitals filed for this visit.  Gen: resting comfortably, no acute distress HEENT: no scleral icterus, pupils equal round and reactive, no palptable cervical adenopathy,  CV Resp: Clear to auscultation bilaterally GI: abdomen is soft, non-tender, non-distended, normal bowel sounds, no hepatosplenomegaly MSK: extremities are warm, no edema.  Skin: warm, no rash Neuro:  no focal deficits Psych: appropriate affect   Diagnostic Studies     Assessment and Plan        Antoine Poche, M.D., F.A.C.C.

## 2018-02-27 ENCOUNTER — Encounter: Payer: Self-pay | Admitting: Cardiology

## 2018-03-29 ENCOUNTER — Other Ambulatory Visit (HOSPITAL_COMMUNITY): Payer: Self-pay | Admitting: Family Medicine

## 2018-03-29 DIAGNOSIS — Z122 Encounter for screening for malignant neoplasm of respiratory organs: Secondary | ICD-10-CM

## 2018-04-17 ENCOUNTER — Ambulatory Visit (HOSPITAL_COMMUNITY): Admission: RE | Admit: 2018-04-17 | Payer: Medicare HMO | Source: Ambulatory Visit

## 2018-06-03 ENCOUNTER — Ambulatory Visit (HOSPITAL_COMMUNITY): Admission: RE | Admit: 2018-06-03 | Payer: Medicare HMO | Source: Ambulatory Visit

## 2020-03-02 ENCOUNTER — Ambulatory Visit: Payer: Medicare HMO | Admitting: General Surgery

## 2020-03-23 ENCOUNTER — Ambulatory Visit: Payer: Medicare HMO | Admitting: General Surgery

## 2020-07-07 ENCOUNTER — Other Ambulatory Visit (HOSPITAL_COMMUNITY): Payer: Self-pay | Admitting: Family Medicine

## 2020-07-07 DIAGNOSIS — F17210 Nicotine dependence, cigarettes, uncomplicated: Secondary | ICD-10-CM

## 2020-07-12 ENCOUNTER — Other Ambulatory Visit (HOSPITAL_COMMUNITY): Payer: Self-pay | Admitting: Family Medicine

## 2020-07-12 DIAGNOSIS — R683 Clubbing of fingers: Secondary | ICD-10-CM

## 2020-09-15 DEATH — deceased

## 2021-02-08 ENCOUNTER — Encounter (INDEPENDENT_AMBULATORY_CARE_PROVIDER_SITE_OTHER): Payer: Self-pay | Admitting: *Deleted
